# Patient Record
Sex: Female | Born: 1980 | Race: Black or African American | Hispanic: No | Smoking: Former smoker
Health system: Southern US, Community
[De-identification: ages and names within clinical notes are randomized; demographics above are authoritative.]

## PROBLEM LIST (undated history)

## (undated) ENCOUNTER — Emergency Department (HOSPITAL_COMMUNITY): Admission: EM | Payer: Managed Care, Other (non HMO)

## (undated) DIAGNOSIS — D649 Anemia, unspecified: Secondary | ICD-10-CM

## (undated) DIAGNOSIS — K259 Gastric ulcer, unspecified as acute or chronic, without hemorrhage or perforation: Secondary | ICD-10-CM

## (undated) DIAGNOSIS — R011 Cardiac murmur, unspecified: Secondary | ICD-10-CM

## (undated) DIAGNOSIS — K219 Gastro-esophageal reflux disease without esophagitis: Secondary | ICD-10-CM

## (undated) DIAGNOSIS — T7840XA Allergy, unspecified, initial encounter: Secondary | ICD-10-CM

## (undated) DIAGNOSIS — R519 Headache, unspecified: Secondary | ICD-10-CM

## (undated) DIAGNOSIS — Z8489 Family history of other specified conditions: Secondary | ICD-10-CM

## (undated) DIAGNOSIS — J45909 Unspecified asthma, uncomplicated: Secondary | ICD-10-CM

## (undated) HISTORY — DX: Allergy, unspecified, initial encounter: T78.40XA

## (undated) HISTORY — DX: Unspecified asthma, uncomplicated: J45.909

---

## 1999-05-30 ENCOUNTER — Emergency Department (HOSPITAL_COMMUNITY): Admission: EM | Admit: 1999-05-30 | Discharge: 1999-05-30 | Payer: Self-pay | Admitting: Emergency Medicine

## 1999-10-29 ENCOUNTER — Emergency Department (HOSPITAL_COMMUNITY): Admission: EM | Admit: 1999-10-29 | Discharge: 1999-10-29 | Payer: Self-pay | Admitting: Emergency Medicine

## 1999-11-25 ENCOUNTER — Encounter: Payer: Self-pay | Admitting: Emergency Medicine

## 1999-11-25 ENCOUNTER — Emergency Department (HOSPITAL_COMMUNITY): Admission: EM | Admit: 1999-11-25 | Discharge: 1999-11-25 | Payer: Self-pay | Admitting: Emergency Medicine

## 2000-02-08 ENCOUNTER — Emergency Department (HOSPITAL_COMMUNITY): Admission: EM | Admit: 2000-02-08 | Discharge: 2000-02-08 | Payer: Self-pay | Admitting: Emergency Medicine

## 2001-03-04 HISTORY — PX: ANKLE SURGERY: SHX546

## 2007-03-05 HISTORY — PX: ANKLE SURGERY: SHX546

## 2008-03-04 HISTORY — PX: HERNIA REPAIR: SHX51

## 2008-03-04 HISTORY — PX: TUBAL LIGATION: SHX77

## 2012-05-29 ENCOUNTER — Inpatient Hospital Stay (HOSPITAL_COMMUNITY)
Admission: AD | Admit: 2012-05-29 | Discharge: 2012-05-29 | Disposition: A | Discharge status: Home / Self Care | Payer: Medicaid Other | Source: Ambulatory Visit | Attending: Obstetrics & Gynecology | Admitting: Obstetrics & Gynecology

## 2012-05-29 DIAGNOSIS — N6459 Other signs and symptoms in breast: Secondary | ICD-10-CM | POA: Insufficient documentation

## 2012-05-29 DIAGNOSIS — R110 Nausea: Secondary | ICD-10-CM | POA: Insufficient documentation

## 2012-05-29 DIAGNOSIS — Z3202 Encounter for pregnancy test, result negative: Secondary | ICD-10-CM

## 2012-05-29 DIAGNOSIS — R5381 Other malaise: Secondary | ICD-10-CM | POA: Insufficient documentation

## 2012-05-29 LAB — POCT PREGNANCY, URINE: Preg Test, Ur: NEGATIVE

## 2012-05-29 LAB — HCG, QUANTITATIVE, PREGNANCY: hCG, Beta Chain, Quant, S: <1 m[IU]/mL (ref <5)

## 2012-05-29 NOTE — MAU Note (Addendum)
Regular cycles, last cycle was 31 days- so is late.  Having symptoms, tired, breast tenderness.  Had 'assure' procedure about 5 yrs ago.   2 home tests, one positive, one neg.  Wants to make sure is not preg with ectopic.  Had cramping felt like cycle was going to start and it didn't.

## 2012-05-29 NOTE — MAU Note (Signed)
Name and DOB verified, pt confirms spelling is correct on armband 

## 2012-05-29 NOTE — MAU Provider Note (Signed)
S: 32 y.o. presents to MAU 5 years post Essure procedure with menses 2 days late and pregnancy symptoms including breast tenderness, fatigue, nausea, and cravings.  Normally, she has regular menstrual cycles so being late is unusual for her.  Because of her Essure, she wants to make sure she does not have an ectopic pregnancy.  She denies abdominal pain, vaginal bleeding, vomiting, or fever/chills.    O: BP 112/61  Pulse 88  Temp(Src) 98.4 F (36.9 C) (Oral)  Resp 18  Ht 5' 4.5" (1.638 m)  Wt 75.751 kg (167 lb)  BMI 28.23 kg/m2  LMP 04/29/2012  Results for orders placed during the hospital encounter of 05/29/12 (from the past 24 hour(s))  POCT PREGNANCY, URINE     Status: None   Collection Time    05/29/12  4:13 PM      Result Value Range   Preg Test, Ur NEGATIVE  NEGATIVE  HCG, QUANTITATIVE, PREGNANCY     Status: None   Collection Time    05/29/12  4:39 PM      Result Value Range   hCG, Beta Chain, Quant, S <1  <5 mIU/mL    A: 1. Negative pregnancy test    P: D/C home Reviewed negative labs with pt Pt reassured that pregnancy not causing symptoms F/U with provider of her choice if no onset of menses in next week or two Return to MAU as needed  Sharen Counter Certified Nurse-Midwife

## 2014-07-07 ENCOUNTER — Ambulatory Visit (HOSPITAL_COMMUNITY)
Admission: RE | Admit: 2014-07-07 | Discharge: 2014-07-07 | Disposition: A | Discharge status: Home / Self Care | Payer: Self-pay | Source: Ambulatory Visit | Attending: Family Medicine | Admitting: Family Medicine

## 2014-07-07 ENCOUNTER — Other Ambulatory Visit (HOSPITAL_COMMUNITY): Payer: Self-pay | Admitting: Family Medicine

## 2014-07-07 DIAGNOSIS — E289 Ovarian dysfunction, unspecified: Secondary | ICD-10-CM | POA: Insufficient documentation

## 2014-07-07 DIAGNOSIS — Z9851 Tubal ligation status: Secondary | ICD-10-CM | POA: Insufficient documentation

## 2014-07-07 DIAGNOSIS — R1032 Left lower quadrant pain: Secondary | ICD-10-CM | POA: Insufficient documentation

## 2015-03-07 ENCOUNTER — Emergency Department (HOSPITAL_COMMUNITY)
Admission: EM | Admit: 2015-03-07 | Discharge: 2015-03-07 | Disposition: A | Discharge status: Home / Self Care | Payer: Self-pay | Attending: Emergency Medicine | Admitting: Emergency Medicine

## 2015-03-07 ENCOUNTER — Emergency Department (HOSPITAL_COMMUNITY): Payer: Self-pay

## 2015-03-07 ENCOUNTER — Emergency Department (HOSPITAL_COMMUNITY): Payer: Medicaid Other

## 2015-03-07 ENCOUNTER — Encounter (HOSPITAL_COMMUNITY): Payer: Self-pay | Admitting: Emergency Medicine

## 2015-03-07 DIAGNOSIS — M545 Low back pain: Secondary | ICD-10-CM | POA: Insufficient documentation

## 2015-03-07 DIAGNOSIS — R1011 Right upper quadrant pain: Secondary | ICD-10-CM | POA: Insufficient documentation

## 2015-03-07 DIAGNOSIS — M546 Pain in thoracic spine: Secondary | ICD-10-CM | POA: Insufficient documentation

## 2015-03-07 DIAGNOSIS — Z792 Long term (current) use of antibiotics: Secondary | ICD-10-CM | POA: Insufficient documentation

## 2015-03-07 DIAGNOSIS — Z87891 Personal history of nicotine dependence: Secondary | ICD-10-CM | POA: Insufficient documentation

## 2015-03-07 DIAGNOSIS — R110 Nausea: Secondary | ICD-10-CM | POA: Insufficient documentation

## 2015-03-07 DIAGNOSIS — Z8719 Personal history of other diseases of the digestive system: Secondary | ICD-10-CM | POA: Insufficient documentation

## 2015-03-07 DIAGNOSIS — R1013 Epigastric pain: Secondary | ICD-10-CM | POA: Insufficient documentation

## 2015-03-07 HISTORY — DX: Gastric ulcer, unspecified as acute or chronic, without hemorrhage or perforation: K25.9

## 2015-03-07 LAB — CBC WITH DIFFERENTIAL/PLATELET
BASOS ABS: 0 10*3/uL (ref 0.0–0.1)
BASOS PCT: 0 %
EOS ABS: 0.1 10*3/uL (ref 0.0–0.7)
Eosinophils Relative: 1 %
HEMATOCRIT: 36.4 % (ref 36.0–46.0)
HEMOGLOBIN: 11.7 g/dL — AB (ref 12.0–15.0)
Lymphocytes Relative: 10 %
Lymphs Abs: 0.9 10*3/uL (ref 0.7–4.0)
MCH: 25.8 pg — ABNORMAL LOW (ref 26.0–34.0)
MCHC: 32.1 g/dL (ref 30.0–36.0)
MCV: 80.4 fL (ref 78.0–100.0)
MONOS PCT: 5 %
Monocytes Absolute: 0.5 10*3/uL (ref 0.1–1.0)
NEUTROS ABS: 7.9 10*3/uL — AB (ref 1.7–7.7)
NEUTROS PCT: 84 %
Platelets: 250 10*3/uL (ref 150–400)
RBC: 4.53 MIL/uL (ref 3.87–5.11)
RDW: 13 % (ref 11.5–15.5)
WBC: 9.4 10*3/uL (ref 4.0–10.5)

## 2015-03-07 LAB — COMPREHENSIVE METABOLIC PANEL
ALBUMIN: 3.5 g/dL (ref 3.5–5.0)
ALK PHOS: 61 U/L (ref 38–126)
ALT: 15 U/L (ref 14–54)
ANION GAP: 10 (ref 5–15)
AST: 33 U/L (ref 15–41)
BILIRUBIN TOTAL: 0.5 mg/dL (ref 0.3–1.2)
BUN: 9 mg/dL (ref 6–20)
CO2: 24 mmol/L (ref 22–32)
Calcium: 8.8 mg/dL — ABNORMAL LOW (ref 8.9–10.3)
Chloride: 104 mmol/L (ref 101–111)
Creatinine, Ser: 0.89 mg/dL (ref 0.44–1.00)
GFR calc Af Amer: >60 mL/min (ref >60)
GFR calc non Af Amer: >60 mL/min (ref >60)
GLUCOSE: 113 mg/dL — AB (ref 65–99)
POTASSIUM: 4.1 mmol/L (ref 3.5–5.1)
SODIUM: 138 mmol/L (ref 135–145)
Total Protein: 6.4 g/dL — ABNORMAL LOW (ref 6.5–8.1)

## 2015-03-07 LAB — URINALYSIS, ROUTINE W REFLEX MICROSCOPIC
Bilirubin Urine: NEGATIVE
Glucose, UA: NEGATIVE mg/dL
Hgb urine dipstick: NEGATIVE
KETONES UR: NEGATIVE mg/dL
LEUKOCYTES UA: NEGATIVE
NITRITE: NEGATIVE
PROTEIN: NEGATIVE mg/dL
Specific Gravity, Urine: 1.007 (ref 1.005–1.030)
pH: 6.5 (ref 5.0–8.0)

## 2015-03-07 LAB — POC URINE PREG, ED: PREG TEST UR: NEGATIVE

## 2015-03-07 LAB — LIPASE, BLOOD: Lipase: 16 U/L (ref 11–51)

## 2015-03-07 MED ORDER — PANTOPRAZOLE SODIUM 20 MG PO TBEC: 20.0000 mg | DELAYED_RELEASE_TABLET | Freq: Every day | ORAL | Status: DC

## 2015-03-07 MED ORDER — ONDANSETRON 4 MG PO TBDP: 4.0000 mg | ORAL_TABLET | Freq: Once | ORAL | Status: DC | PRN

## 2015-03-07 MED ORDER — DOCUSATE SODIUM 100 MG PO CAPS: 100.0000 mg | ORAL_CAPSULE | Freq: Two times a day (BID) | ORAL | Status: DC

## 2015-03-07 MED ORDER — ONDANSETRON HCL 4 MG/2ML IJ SOLN
4.0000 mg | Freq: Once | INTRAMUSCULAR | Status: AC
  Administered 2015-03-07: 4 mg via INTRAVENOUS
  Filled 2015-03-07: qty 2

## 2015-03-07 MED ORDER — SODIUM CHLORIDE 0.9 % IV BOLUS (SEPSIS)
1000.0000 mL | Freq: Once | INTRAVENOUS | Status: AC
  Administered 2015-03-07: 1000 mL via INTRAVENOUS

## 2015-03-07 MED ORDER — PROMETHAZINE HCL 25 MG/ML IJ SOLN
25.0000 mg | Freq: Once | INTRAMUSCULAR | Status: AC
  Administered 2015-03-07: 25 mg via INTRAVENOUS
  Filled 2015-03-07: qty 1

## 2015-03-07 MED ORDER — MORPHINE SULFATE (PF) 4 MG/ML IV SOLN
4.0000 mg | Freq: Once | INTRAVENOUS | Status: AC
  Administered 2015-03-07: 4 mg via INTRAVENOUS
  Filled 2015-03-07: qty 1

## 2015-03-07 MED ORDER — MORPHINE SULFATE (PF) 4 MG/ML IV SOLN
4.0000 mg | Freq: Once | INTRAVENOUS | Status: AC
Start: 2015-03-07 — End: 2015-03-07
  Administered 2015-03-07: 4 mg via INTRAVENOUS
  Filled 2015-03-07: qty 1

## 2015-03-07 MED ORDER — GI COCKTAIL ~~LOC~~
30.0000 mL | Freq: Once | ORAL | Status: AC
  Administered 2015-03-07: 30 mL via ORAL
  Filled 2015-03-07: qty 30

## 2015-03-07 MED ORDER — HYDROCODONE-ACETAMINOPHEN 5-325 MG PO TABS: 2.0000 | ORAL_TABLET | ORAL | Status: DC | PRN

## 2015-03-07 MED ORDER — HYDROMORPHONE HCL 1 MG/ML IJ SOLN
1.0000 mg | Freq: Once | INTRAMUSCULAR | Status: AC
  Administered 2015-03-07: 1 mg via INTRAVENOUS
  Filled 2015-03-07: qty 1

## 2015-03-07 MED ORDER — ONDANSETRON 4 MG PO TBDP: ORAL_TABLET | ORAL | Status: DC

## 2015-03-07 MED ORDER — DICYCLOMINE HCL 20 MG PO TABS: 20.0000 mg | ORAL_TABLET | Freq: Two times a day (BID) | ORAL | Status: DC | PRN

## 2015-03-07 NOTE — Discharge Instructions (Signed)

## 2015-03-07 NOTE — ED Notes (Signed)
Pt presents with [facility] from home ambulatory from ambulance c/o RUQ abd pain that is described as stabbing and radiates to midback; pt also reports nausea without vomiting or diarrhea; pt denies fever, rebound tenderness; pt states she has a hx of stomach ulcer and gallbladder sludge

## 2015-03-07 NOTE — ED Notes (Signed)
Patient transported to Ultrasound 

## 2015-03-07 NOTE — ED Notes (Signed)
Pt requested to go to restroom.  This RN stayed with patient while ambulating to bathroom.  Pt's gait is steady and even.

## 2015-03-07 NOTE — ED Provider Notes (Signed)
CSN: [id number]     Arrival date & time 03/07/15  0036 History  By signing my name below, I, Kylie Rivas, attest that this documentation has been prepared under the direction and in the presence of Loren Raceravid Adelbert Gaspard, MD . Electronically Signed: Freida Busmaniana Rivas, Scribe. 03/07/2015. 3:27 AM.     Chief Complaint  Patient presents with  . Abdominal Pain  . Nausea     The history is provided by the patient. No language interpreter was used.   HPI Comments:  Kylie Rivas is a 35 y.o. female who presents to the Emergency Department complaining of epigastric abdominal pain that radiates to her RUQ and to back onset ~ 2200 last night. She notes her pain has been constant since onset but has improved since onset. Pt reports associated nausea. She reports h/o stomach ulcer; states at that time she had abdominal imaging that showed gallbladder sludge.She denies fever, dysuria, hematuria, diarrhea, vomiting, constipation, and swelling in her BLE.  Her last normal BM was yesterday afternoon. No alleviating factors noted. She reports h/o of abdominal surgery as well-hernia repair.   Past Medical History  Diagnosis Date  . Stomach ulcer    Past Surgical History  Procedure Laterality Date  . Hernia repair  2010  . Ankle surgery Right 2013  . Ankle surgery Left 2009  . Tubal ligation  2010   History reviewed. No pertinent family history. Social History  Substance Use Topics  . Smoking status: Former Smoker    Quit date: 02/28/2015  . Smokeless tobacco: None  . Alcohol Use: Yes     Comment: socially   OB History    No data available     Review of Systems  Constitutional: Negative for fever and chills.  Respiratory: Negative for shortness of breath.   Cardiovascular: Negative for chest pain and leg swelling.  Gastrointestinal: Positive for nausea and abdominal pain. Negative for vomiting, diarrhea, constipation and blood in stool.  Genitourinary: Positive for flank pain. Negative for  dysuria, hematuria and pelvic pain.  Musculoskeletal: Positive for myalgias and back pain. Negative for neck pain and neck stiffness.  Skin: Negative for rash and wound.  Neurological: Negative for dizziness, weakness, light-headedness, numbness and headaches.  All other systems reviewed and are negative.    Allergies  Aspirin and Iodinated diagnostic agents  Home Medications   Prior to Admission medications   Medication Sig Start Date End Date Taking? Authorizing Provider  doxycycline (VIBRAMYCIN) 100 MG capsule Take 100 mg by mouth 2 (two) times daily. 02/16/15  Yes Historical Provider, MD  dicyclomine (BENTYL) 20 MG tablet Take 1 tablet (20 mg total) by mouth 2 (two) times daily as needed for spasms. 03/07/15   Loren Raceravid Angelle Isais, MD  docusate sodium (COLACE) 100 MG capsule Take 1 capsule (100 mg total) by mouth every 12 (twelve) hours. 03/07/15   Loren Raceravid Jaye Saal, MD  HYDROcodone-acetaminophen (NORCO) 5-325 MG tablet Take 2 tablets by mouth every 4 (four) hours as needed. 03/07/15   Loren Raceravid Lundyn Coste, MD  ondansetron (ZOFRAN ODT) 4 MG disintegrating tablet 4mg  ODT q4 hours prn nausea/vomit 03/07/15   Loren Raceravid Joeanne Robicheaux, MD  pantoprazole (PROTONIX) 20 MG tablet Take 1 tablet (20 mg total) by mouth daily. 03/07/15   Loren Raceravid Ardie Dragoo, MD   BP 104/68 mmHg  Pulse 69  Temp(Src) 98.3 F (36.8 C) (Oral)  Resp 16  Ht 5\' 4"  (1.626 m)  Wt 153 lb (69.4 kg)  BMI 26.25 kg/m2  SpO2 99%  LMP 02/24/2015 Physical Exam  Constitutional: She  is oriented to person, place, and time. She appears well-developed and well-nourished. No distress.  HENT:  Head: Normocephalic and atraumatic.  Mouth/Throat: Oropharynx is clear and moist.  Eyes: EOM are normal. Pupils are equal, round, and reactive to light.  Neck: Normal range of motion. Neck supple.  Cardiovascular: Normal rate and regular rhythm.   Pulmonary/Chest: Effort normal and breath sounds normal. No respiratory distress. She has no wheezes. She has no rales.   Abdominal: Soft. Bowel sounds are normal.  Patient has epigastric and right upper quadrant tenderness with palpation. There is no rebound or guarding.  Musculoskeletal: Normal range of motion. She exhibits tenderness. She exhibits no edema.  Patient with tenderness along the right paraspinal musculature of the lower thoracic and upper lumbar region. No definite CVA tenderness. No lower extremity swelling or pain.  Neurological: She is alert and oriented to person, place, and time.  Moves all extremities without deficit. Sensation is fully intact.  Skin: Skin is warm and dry. No rash noted. No erythema.  Psychiatric: She has a normal mood and affect. Her behavior is normal.  Nursing note and vitals reviewed.   ED Course  Procedures   DIAGNOSTIC STUDIES:  Oxygen Saturation is 100% on RA, normal by my interpretation.    COORDINATION OF CARE:  2:03 AM Discussed treatment plan with pt at bedside and pt agreed to plan.  Labs Review Labs Reviewed  URINALYSIS, ROUTINE W REFLEX MICROSCOPIC (NOT AT [facility]) - Abnormal; Notable for the following:    Color, Urine STRAW (*)    All other components within normal limits  CBC WITH DIFFERENTIAL/PLATELET - Abnormal; Notable for the following:    Hemoglobin 11.7 (*)    MCH 25.8 (*)    Neutro Abs 7.9 (*)    All other components within normal limits  COMPREHENSIVE METABOLIC PANEL - Abnormal; Notable for the following:    Glucose, Bld 113 (*)    Calcium 8.8 (*)    Total Protein 6.4 (*)    All other components within normal limits  LIPASE, BLOOD  POC URINE PREG, ED    Imaging Review Ct Abdomen Pelvis Wo Contrast  03/07/2015  CLINICAL DATA:  Acute onset of right upper quadrant abdominal pain, radiating to the mid back. Nausea. Initial encounter. EXAM: CT ABDOMEN AND PELVIS WITHOUT CONTRAST TECHNIQUE: Multidetector CT imaging of the abdomen and pelvis was performed following the standard protocol without IV contrast. COMPARISON:  Right upper quadrant  ultrasound performed earlier today at 2:23 a.m., and pelvic ultrasound performed 07/07/2014 FINDINGS: Minimal bibasilar atelectasis is noted. The liver and spleen are unremarkable in appearance. The gallbladder is within normal limits. The pancreas and adrenal glands are unremarkable. The kidneys are unremarkable in appearance. There is no evidence of hydronephrosis. No renal or ureteral stones are seen. No perinephric stranding is appreciated. No free fluid is identified. The small bowel is unremarkable in appearance. The stomach is within normal limits. No acute vascular abnormalities are seen. The appendix is normal in caliber, without evidence of appendicitis. The colon is unremarkable in appearance. The bladder is moderately distended and grossly unremarkable. The uterus is unremarkable in appearance. Postoperative change is noted along the fallopian tubes bilaterally. No inguinal lymphadenopathy is seen. No acute osseous abnormalities are identified. IMPRESSION: No acute abnormality seen within the abdomen or pelvis. Electronically Signed   By: Roanna Raider M.D.   On: 03/07/2015 05:43   US Abdomen Limited  03/07/2015  CLINICAL DATA:  Acute onset of right upper quadrant abdominal pain.  Initial encounter. EXAM: US ABDOMEN LIMITED - RIGHT UPPER QUADRANT COMPARISON:  None. FINDINGS: Gallbladder: No gallstones or wall thickening visualized. Partially contracted. No sonographic Murphy sign noted by sonographer. Common bile duct: Diameter: 0.3 cm, within normal limits in caliber. Liver: A mildly heterogeneous hyperechoic focus is noted within the right hepatic lobe, measuring 1.7 x 1.5 x 1.7 cm. This is nonspecific. Within normal limits in parenchymal echogenicity. IMPRESSION: 1. No acute abnormality seen within the right upper quadrant. 2. Mildly heterogeneous hyperechoic focus within the right hepatic lobe, measuring 1.7 cm. Though this may reflect an hemangioma, this is nonspecific. Further evaluation (dynamic  liver protocol MRI or CT) could be considered as deemed clinically appropriate. Electronically Signed   By: Roanna Raider M.D.   On: 03/07/2015 03:38   I have personally reviewed and evaluated these images and lab results as part of my medical decision-making.   EKG Interpretation None      MDM   Final diagnoses:  Epigastric pain    I personally performed the services described in this documentation, which was scribed in my presence. The recorded information has been reviewed and is accurate.   Patient with persistent pain despite medication. Ultrasound without evidence of really very cause. Laboratory workup is normal. CT abdomen was requested by the patient. I think unlikely, possibly renal stone could be causing the patient's symptoms.  CT scan without acute abnormality. Patient does have moderate amount stool in the right colon. She is feeling much better after IV medication. We'll discharge home to follow-up with gastroenterology. Return precautions have been given.   Loren Racer, MD 03/07/15 [phone]

## 2016-10-07 ENCOUNTER — Encounter: Payer: Self-pay | Admitting: Internal Medicine

## 2016-10-07 ENCOUNTER — Ambulatory Visit (INDEPENDENT_AMBULATORY_CARE_PROVIDER_SITE_OTHER): Payer: Self-pay | Admitting: Internal Medicine

## 2016-10-07 VITALS — BP 108/68 | HR 74 | Resp 14 | Ht 64.5 in | Wt 171.0 lb

## 2016-10-07 DIAGNOSIS — J45909 Unspecified asthma, uncomplicated: Secondary | ICD-10-CM | POA: Insufficient documentation

## 2016-10-07 DIAGNOSIS — T7840XA Allergy, unspecified, initial encounter: Secondary | ICD-10-CM | POA: Insufficient documentation

## 2016-10-07 DIAGNOSIS — R300 Dysuria: Secondary | ICD-10-CM

## 2016-10-07 DIAGNOSIS — Z124 Encounter for screening for malignant neoplasm of cervix: Secondary | ICD-10-CM

## 2016-10-07 DIAGNOSIS — E010 Iodine-deficiency related diffuse (endemic) goiter: Secondary | ICD-10-CM

## 2016-10-07 DIAGNOSIS — N898 Other specified noninflammatory disorders of vagina: Secondary | ICD-10-CM

## 2016-10-07 LAB — POCT URINALYSIS DIPSTICK
BILIRUBIN UA: NEGATIVE
GLUCOSE UA: NEGATIVE
KETONES UA: NEGATIVE
Nitrite, UA: NEGATIVE
PROTEIN UA: 15
SPEC GRAV UA: 1.02 (ref 1.010–1.025)
Urobilinogen, UA: 0.2 E.U./dL
pH, UA: 5 (ref 5.0–8.0)

## 2016-10-07 LAB — POCT WET PREP WITH KOH
CLUE CELLS WET PREP PER HPF POC: NEGATIVE
KOH Prep POC: NEGATIVE
RBC Wet Prep HPF POC: NEGATIVE
TRICHOMONAS UA: NEGATIVE
YEAST WET PREP PER HPF POC: NEGATIVE

## 2016-10-07 MED ORDER — EPINEPHRINE 0.3 MG/0.3ML IJ SOAJ: 0.3000 mg | Freq: Once | INTRAMUSCULAR | 1 refills | Status: AC

## 2016-10-07 MED ORDER — NITROFURANTOIN MONOHYD MACRO 100 MG PO CAPS: 100.0000 mg | ORAL_CAPSULE | Freq: Two times a day (BID) | ORAL | 0 refills | Status: DC

## 2016-10-07 MED ORDER — PHENAZOPYRIDINE HCL 200 MG PO TABS: 200.0000 mg | ORAL_TABLET | Freq: Three times a day (TID) | ORAL | 0 refills | Status: DC | PRN

## 2016-10-07 NOTE — Progress Notes (Signed)
Subjective:    Patient ID: Kylie Rivas, female    DOB: [birth year], 36 y.o.   MRN: [id number]  HPI   New to establish  Treated for UTI with Cipro 500 mg twice daily for 7 days starting about 09/21/2016.   Had similar symptoms to what she has had for past 3 days.   Dysuria with initiation of urination, mild hematuria.  At end of urination, deep itching sensation. No fever or flank pain, though previously before treatment, had bilateral low back pain and left sided pelvic pain.  Again, those are not symptoms now. No vaginal discharge, no odor.   Is sexually active with one female partner and he is not having any symptoms to her knowledge.  They do not use condoms.  Would like to include pap today as performing GU exam.  Last pap 2013.  Always normal in past.  Mother and sister with abnormal paps in past.  No outpatient prescriptions have been marked as taking for the 10/07/16 encounter (Office Visit) with Mack Hook, MD.    Allergies  Allergen Reactions  . Aspirin Anaphylaxis  . Iodinated Diagnostic Agents Anaphylaxis    Past Medical History:  Diagnosis Date  . Allergy    Bee stings, shellfish  . Asthma    winter time.  Last episode 2017  . Stomach ulcer     Past Surgical History:  Procedure Laterality Date  . ANKLE SURGERY Right 2003   ligament repair  . ANKLE SURGERY Left 2009   LIgament repair  . HERNIA REPAIR  7096   umbilical  . TUBAL LIGATION  2010    Family History  Problem Relation Age of Onset  . Diabetes Mother   . Hypertension Mother   . Seizures Mother        due to strokes in past  . Stroke Mother   . Kidney disease Mother        recurrent kidney stones--unclear what type  . Heart disease Mother        Ischemic Heart Disease  . Pulmonary embolism Mother        unknown clotting disorder.  Patient states she was tested for same and did not have.  . Diabetes Father   . Heart disease Father        MI  . Stroke Father   . Asthma  Sister   . Hypertension Sister   . Cancer Brother        Primary lung, but met to nasopharyngeal area.    Social History   Social History  . Marital status: Married    Spouse name: N/A  . Number of children: N/A  . Years of education: N/A   Occupational History  . Not on file.   Social History Main Topics  . Smoking status: Former Smoker    Quit date: 02/28/2015  . Smokeless tobacco: Not on file  . Alcohol use Yes     Comment: socially  . Drug use: No  . Sexual activity: Not on file   Other Topics Concern  . Not on file   Social History Narrative  . No narrative on file       Review of Systems     Objective:   Physical Exam  NAD HEENT:  PERRL, EOMI, TMs pearly gray, throat without injection Neck: Supple, mild thyroid enlargement. Chest:  CTA CV:  RRR with normal S1 and S2, No S3, S4 or murmur. Radial and DP pulses normal and equal Abd:  S,  Mild suprapubic tenderness, no flank tenderness, No HSM or mass, + BS GU:  Normal external genitalia.  Scant vaginal discharge in proximal vaginal canal.  Cervix without lesion.  No CMT.  No uterine or adnexal mass or tenderness.  UA with trace Leuk/protein and blood. Wet prep unremarkable.      Assessment & Plan:  1.  Dysuria:  Send urine for culture.  Pyridium and Macrobid.    2.  Vaginal Discharge:  No findings on wet prep.  Sending GC/chlamydia  3.  Thyromegaly:  TSH.  4.  HM:  Pap today.

## 2016-10-08 LAB — GC/CHLAMYDIA PROBE AMP
CHLAMYDIA, DNA PROBE: NEGATIVE
NEISSERIA GONORRHOEAE BY PCR: NEGATIVE

## 2016-10-08 LAB — TSH: TSH: 2.1 u[IU]/mL (ref 0.450–4.500)

## 2016-10-08 LAB — CYTOLOGY - PAP

## 2016-10-09 LAB — URINE CULTURE

## 2016-11-20 ENCOUNTER — Ambulatory Visit (INDEPENDENT_AMBULATORY_CARE_PROVIDER_SITE_OTHER): Payer: Medicaid Other | Admitting: Internal Medicine

## 2016-11-20 VITALS — BP 120/80 | HR 80 | Temp 98.7°F

## 2016-11-20 DIAGNOSIS — J0140 Acute pansinusitis, unspecified: Secondary | ICD-10-CM

## 2016-11-20 DIAGNOSIS — R519 Headache, unspecified: Secondary | ICD-10-CM

## 2016-11-20 DIAGNOSIS — R51 Headache: Secondary | ICD-10-CM | POA: Diagnosis not present

## 2016-11-20 MED ORDER — AMOXICILLIN-POT CLAVULANATE 875-125 MG PO TABS: 1.0000 | ORAL_TABLET | Freq: Two times a day (BID) | ORAL | 0 refills | Status: DC

## 2016-11-20 MED ORDER — FLUCONAZOLE 150 MG PO TABS: ORAL_TABLET | ORAL | 0 refills | Status: DC

## 2016-12-09 ENCOUNTER — Encounter: Payer: Self-pay | Admitting: Internal Medicine

## 2016-12-25 ENCOUNTER — Telehealth: Payer: Self-pay | Admitting: Internal Medicine

## 2016-12-25 NOTE — Telephone Encounter (Signed)
Patient would like Dicyclomine 20mg  1 twice a day as needed for spasms.  Patient would also like zantac.  [facility] on [address].

## 2016-12-26 MED ORDER — DICYCLOMINE HCL 20 MG PO TABS: ORAL_TABLET | ORAL | 0 refills | Status: DC

## 2016-12-26 MED ORDER — FAMOTIDINE 20 MG PO TABS: ORAL_TABLET | ORAL | 2 refills | Status: DC

## 2016-12-26 NOTE — Telephone Encounter (Signed)
Reported in person she is having epigastric pain--burning mainly with some sense of cramping.  Using NSAIDS regularly.  No melena or hematochezia.  Mild tenderness over midepigastric area only--not R or L UQ  Discussed using Tylenol for headache pain and stopping NSAIDS.  Will send in Rx for Famotidine and short term Rx for Dicyclomine.

## 2016-12-29 ENCOUNTER — Encounter: Payer: Self-pay | Admitting: Internal Medicine

## 2016-12-29 NOTE — Progress Notes (Signed)
   Subjective:    Patient ID: Kylie Rivas, female    DOB: Aug 28, 1980, 36 y.o.   MRN: [id number]  HPI   Saw Minka in her home as was having a severe headache and unable to drive.   Pain from back of head to frontal areas bilaterally.  No definite fever.  Very nauseated with headache.   Lot of pain in bifrontal and maxillary areas as well. + congestion with cough for a number of days. Had cut back on caffeinated sodas signficantly in recent days. Ibuprofen not touching the headache pain and difficulty keeping them down. No diarrhea  No outpatient prescriptions have been marked as taking for the 11/20/16 encounter (Office Visit) with Julieanne MansonMulberry, Treyveon Mochizuki, MD.   Allergies  Allergen Reactions  . Aspirin Anaphylaxis  . Iodinated Diagnostic Agents Anaphylaxis      Review of Systems     Objective:   Physical Exam Sitting in bed in mild to moderate distress with headache HEENT:  PERRL, EOMI, TMs pearly gray. Tender over bilateral frontal and maxillary sinuses.  Throat without injection Neck:  Supple, No adenopathy Chest:  CTA CV:  RRR without murmur or rub, radial pulses normal and equal Abd:  S, NT, No HSM or masses, + BS Skin:  No rash         Assessment & Plan:  Possible acute sinusitis:  Treat with Augmentin 875 mg/125 mg twice daily for 10 days.  Fluconazole filled should she develop a yeast infection.    Headache likely also related to sudden discontinuation of her significant soda/caffeine intake.  Discussed taking in some caffeine today and to gradually wean herself from caffeinated soda

## 2017-06-27 ENCOUNTER — Other Ambulatory Visit (INDEPENDENT_AMBULATORY_CARE_PROVIDER_SITE_OTHER): Payer: BLUE CROSS/BLUE SHIELD

## 2017-06-27 DIAGNOSIS — R635 Abnormal weight gain: Secondary | ICD-10-CM | POA: Diagnosis not present

## 2017-06-27 DIAGNOSIS — L659 Nonscarring hair loss, unspecified: Secondary | ICD-10-CM | POA: Diagnosis not present

## 2017-06-27 DIAGNOSIS — E7800 Pure hypercholesterolemia, unspecified: Secondary | ICD-10-CM

## 2017-06-27 DIAGNOSIS — R5383 Other fatigue: Secondary | ICD-10-CM

## 2017-06-27 DIAGNOSIS — L299 Pruritus, unspecified: Secondary | ICD-10-CM

## 2017-06-27 NOTE — Patient Instructions (Signed)
Nizoral shampoo

## 2017-06-27 NOTE — Progress Notes (Signed)
   Subjective:    Patient ID: Beryl Meagerherice Underwood-Hazley, female    DOB: June 30, 1980, 37 y.o.   MRN: [id number]  HPI   Hairstylist yesterday noted hair loss on top of Jaylenn's head--seems to be progressing over last few months.  Also has noted loss from lateral eyebrows.  Has had flaking and itching of entire scalp, though worse on top. The latter over last 2 months. Has not changed hair care products, though she notes this really seemed to start after she began getting her hair cut short with current hairstylist. Patient complains of fatigue, weight gain.  No definite skin dryness or flaking elsewhere. Has had both constipation and diarrhea.  Basically, has hard stool, followed by loose watery stools. Has tried to change her diet as was drinking a lot of soda and snack foods, but has backed off that substantially and has continued to gain weight. Stopped smoking 01/11/2017. Has also been having hot flashes and night sweats for the last year.  Periods are now every 3 weeks--also for maybe the last year. Was noted in previous exams to have a generous thyroid or even a nodule, the latter in 2012.  She has never had an ultrasound.  TSH 10/07/2016 was normal. Significant stress in life.  No outpatient medications have been marked as taking for the 06/27/17 [facility] (Lab[facility].    Allergies  Allergen Reactions  . Aspirin Anaphylaxis  . Iodinated Diagnostic Agents Anaphylaxis          Review of Systems     Objective:   Physical Exam NAD HEENT:  Top of scalp with flaking with underlying mild erythema and shininess of scalp.  Significant generalized hair loss. Unable to see significant hair loss at eyebrows as penciled in. Neck:  Supple, No adenopathy, mild thyromegaly, but no definite nodule. CV:  RRR Skin :  No dryness or rash Neuro:  Ankle reflexes 2+/4       Assessment & Plan:  1.  Fatigue, weight gain, hair loss:  Recheck TSH, also CBC, CMP.  Encouraged  counseling for stress as well  2.  Hair loss with inflammatory changes to scalp:  [facility]  If significant delay in referral, will reconsider local dermatologist. To check with hairstylist about her hair care products used when Eddie is there. Nizoral shampoo at least 3 times weekly.  3.  Weight gain:  To continue to work on diet/soda intake and increase physical activity as well as work on stress.  4.  History of hypercholesterolemia:  Lipids, especially with weight gain.

## 2017-06-28 LAB — CBC WITH DIFFERENTIAL/PLATELET
BASOS ABS: 0 10*3/uL (ref 0.0–0.2)
Basos: 1 %
EOS (ABSOLUTE): 0.3 10*3/uL (ref 0.0–0.4)
Eos: 4 %
Hematocrit: 37.5 % (ref 34.0–46.6)
Hemoglobin: 11.8 g/dL (ref 11.1–15.9)
IMMATURE GRANULOCYTES: 0 %
Immature Grans (Abs): 0 10*3/uL (ref 0.0–0.1)
Lymphocytes Absolute: 2.4 10*3/uL (ref 0.7–3.1)
Lymphs: 36 %
MCH: 25.9 pg — ABNORMAL LOW (ref 26.6–33.0)
MCHC: 31.5 g/dL (ref 31.5–35.7)
MCV: 82 fL (ref 79–97)
MONOS ABS: 0.5 10*3/uL (ref 0.1–0.9)
Monocytes: 8 %
NEUTROS PCT: 51 %
Neutrophils Absolute: 3.4 10*3/uL (ref 1.4–7.0)
PLATELETS: 315 10*3/uL (ref 150–379)
RBC: 4.56 x10E6/uL (ref 3.77–5.28)
RDW: 13.8 % (ref 12.3–15.4)
WBC: 6.7 10*3/uL (ref 3.4–10.8)

## 2017-06-28 LAB — COMPREHENSIVE METABOLIC PANEL
ALK PHOS: 57 IU/L (ref 39–117)
ALT: 6 IU/L (ref 0–32)
AST: 9 IU/L (ref 0–40)
Albumin/Globulin Ratio: 1.6 (ref 1.2–2.2)
Albumin: 4.1 g/dL (ref 3.5–5.5)
BUN/Creatinine Ratio: 17 (ref 9–23)
BUN: 13 mg/dL (ref 6–20)
Bilirubin Total: <0.2 mg/dL (ref 0.0–1.2)
CALCIUM: 8.9 mg/dL (ref 8.7–10.2)
CO2: 24 mmol/L (ref 20–29)
CREATININE: 0.76 mg/dL (ref 0.57–1.00)
Chloride: 105 mmol/L (ref 96–106)
GFR calc Af Amer: 116 mL/min/{1.73_m2} (ref >59)
GFR, EST NON AFRICAN AMERICAN: 101 mL/min/{1.73_m2} (ref >59)
Globulin, Total: 2.5 g/dL (ref 1.5–4.5)
Glucose: 90 mg/dL (ref 65–99)
POTASSIUM: 4.4 mmol/L (ref 3.5–5.2)
Sodium: 141 mmol/L (ref 134–144)
Total Protein: 6.6 g/dL (ref 6.0–8.5)

## 2017-06-28 LAB — LIPID PANEL W/O CHOL/HDL RATIO
Cholesterol, Total: 183 mg/dL (ref 100–199)
HDL: 55 mg/dL (ref >39)
LDL CALC: 101 mg/dL — AB (ref 0–99)
Triglycerides: 134 mg/dL (ref 0–149)
VLDL Cholesterol Cal: 27 mg/dL (ref 5–40)

## 2017-06-28 LAB — TSH: TSH: 1.84 u[IU]/mL (ref 0.450–4.500)

## 2017-07-08 ENCOUNTER — Other Ambulatory Visit: Payer: Self-pay | Admitting: Internal Medicine

## 2017-07-08 ENCOUNTER — Encounter: Payer: Self-pay | Admitting: Internal Medicine

## 2017-07-08 MED ORDER — ALPRAZOLAM 1 MG PO TABS: ORAL_TABLET | ORAL | 0 refills | Status: DC

## 2017-07-08 NOTE — Progress Notes (Signed)
Has decisions to make to keep family safe.

## 2017-08-14 ENCOUNTER — Telehealth: Payer: Self-pay | Admitting: Internal Medicine

## 2017-08-14 MED ORDER — AZITHROMYCIN 500 MG PO TABS: ORAL_TABLET | ORAL | 0 refills | Status: DC

## 2017-08-14 NOTE — Telephone Encounter (Signed)
Cough for 1 week that is worsening with posterior pharyngeal drainage, productive of thick dark yellow mucous.  Chest wall starting to hurt with cough.  Nose running as well and congested. No definite dyspnea.  Unable to sleep flat due to drainage. Has been trying Tylenol severe sinus OTC with ibuprofen, but stopped all as her stomach starting hurting. No fever.  HEENT:  Tender over maxillary sinuses, TMs pearly gray, throat without injection or exudate. Neck:  Supple no adenopathy, generous thyroid without nodule. Chest:  CTA CV:  RRR without murmur or rub.  A/P:  Acute sinusitis:  Dayquil/Nyquil combination. Azithromycin 500 mg once daily for 5 days.

## 2017-09-09 ENCOUNTER — Encounter: Payer: Self-pay | Admitting: Internal Medicine

## 2017-09-09 ENCOUNTER — Ambulatory Visit (INDEPENDENT_AMBULATORY_CARE_PROVIDER_SITE_OTHER): Payer: BLUE CROSS/BLUE SHIELD | Admitting: Internal Medicine

## 2017-09-09 VITALS — BP 100/62 | HR 78 | Resp 12 | Ht 64.5 in | Wt 201.0 lb

## 2017-09-09 DIAGNOSIS — J0101 Acute recurrent maxillary sinusitis: Secondary | ICD-10-CM

## 2017-09-09 MED ORDER — FLUCONAZOLE 150 MG PO TABS: ORAL_TABLET | ORAL | 0 refills | Status: DC

## 2017-09-09 MED ORDER — AMOXICILLIN-POT CLAVULANATE 875-125 MG PO TABS: ORAL_TABLET | ORAL | 0 refills | Status: DC

## 2017-09-09 MED ORDER — PREDNISONE 20 MG PO TABS: ORAL_TABLET | ORAL | 0 refills | Status: DC

## 2017-09-09 NOTE — Progress Notes (Signed)
   Subjective:    Patient ID: Kylie Rivas, female    DOB: November 17, 1980, 37 y.o.   MRN: [id number]  HPI   Has been dealing with cough productive of green and yellow mucous with sinus pressure and posterior pharyngeal drainage.   Was treated with Azithromycin 500 mg once daily for 5 days starting just after 08/14/2017. Her mucous cleared, cough improved.  She continued with posterior pharyngeal drainage. At the time she moved to another home.  The home previously with a smoker.  She sometimes feels she smells an odor.  She does not note a musty odor.  Has not noted any mold or mildew.  No insect infestation.  Windows are closed.  Air conditioning works well.   Started feeling poorly again 5 days ago.  Started sneezing and tickle in throat.  Cough worsened again 2 days ago.  Was vomiting with amount of posterior pharyngeal drainage.  Mucous is now yellow and brown.   Feels most of her cough is just from drainage.  Has to prop up at night.   No fever, but sweating a lot. Taking Fexofenadine for 3 days.   Taking Mucinex as well--helps for a while to decrease mucous production. Has not returned to smoking.  Current Meds  Medication Sig  . famotidine (PEPCID) 20 MG tablet 2 tabs by mouth at bedtime daily  . Fexofenadine HCl (MUCINEX ALLERGY PO) Take by mouth.    Allergies  Allergen Reactions  . Aspirin Anaphylaxis  . Iodinated Diagnostic Agents Anaphylaxis   Review of Systems     Objective:   Physical Exam  NAD HEENT:  PERRL, EOMI, TMs pearly gray.  Tender over maxillary sinuses.  Left nasal mucosa more than left with swelling and redness with yellow discharge.  Throat without injection or exudate. Neck:  Supple, No adenopathy Chest:  CTA CV:  RRR without murmur or rub.  Radial pulses normal and equal Abd:  S, NT, No HSM or mass, + BS    Assessment & Plan:  Sinusitis:  Augmentin 875/125 mg twice daily for 10 days.  Prednisone 20 mg daily for 5 days.   Nasal  saline. Mucinex Fluconazole Rx sent if develops vaginal yeast.

## 2017-09-09 NOTE — Patient Instructions (Addendum)
Drink lots of water. Get a neti pot and use daily for saline flush of sinuses Look at your intake vent in home and make sure it and filter are clean.  Mucinex or Dayquil during day and Nyquil at night.   Elevate HOB for sleep

## 2018-01-09 ENCOUNTER — Encounter: Payer: Self-pay | Admitting: Internal Medicine

## 2018-01-09 MED ORDER — DICYCLOMINE HCL 20 MG PO TABS: ORAL_TABLET | ORAL | 1 refills | Status: DC

## 2018-01-09 NOTE — Progress Notes (Signed)
Under lots of stress. Brother with recurrence of metastatic lung cancer (sinuses/brain) Has been having alternating constipation/diarrhea with lots of mucous with stools. No melena or hematochezia Discussed IBS and starting 1 cap of Metamucil daily and to gradually work up to 4 caps daily Will also try antispasmodic, dicyclomine

## 2018-01-16 ENCOUNTER — Ambulatory Visit (INDEPENDENT_AMBULATORY_CARE_PROVIDER_SITE_OTHER): Payer: BLUE CROSS/BLUE SHIELD | Admitting: Internal Medicine

## 2018-01-16 ENCOUNTER — Encounter: Payer: Self-pay | Admitting: Internal Medicine

## 2018-01-16 VITALS — BP 110/64 | HR 70 | Resp 12 | Ht 64.5 in | Wt 198.0 lb

## 2018-01-16 DIAGNOSIS — G4700 Insomnia, unspecified: Secondary | ICD-10-CM

## 2018-01-16 DIAGNOSIS — K582 Mixed irritable bowel syndrome: Secondary | ICD-10-CM

## 2018-01-16 DIAGNOSIS — F439 Reaction to severe stress, unspecified: Secondary | ICD-10-CM

## 2018-01-16 DIAGNOSIS — S01512A Laceration without foreign body of oral cavity, initial encounter: Secondary | ICD-10-CM | POA: Diagnosis not present

## 2018-01-16 MED ORDER — MUPIROCIN 2 % EX OINT: 1.0000 "application " | TOPICAL_OINTMENT | Freq: Two times a day (BID) | CUTANEOUS | 0 refills | Status: DC
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
## 2018-01-16 NOTE — Patient Instructions (Addendum)
Goal #1:  Get a calendar with space for writing goals  Goal #2:  Improve Sleep  Regular bedtime and awake time--even on weekends or Wednesday  Turn off TV about 1 hour before bedtime--consider warm bath/read to calm  If unable to fall asleep in 20 minutes, or awaken and not able to get back to sleep in 20 minutes, out of bed to somewhere quiet and comfortable and read.  No TV, phone, computer  Cut out caffeine and chocolate after noon/lunch  No daytime napping  Pick a time (sounds like morning) to get outside and walk or whatever, but need daylight and regular physical activity--easy to combine both--for sleep.  Goal #3:  Improve Diet  Family meeting to discuss everyone pitching in to help out with getting life more organized and less stressful  Kids to work on a Education officer, environmentalregular revolving menu for dinner and take turns getting it started, asking for ingredients, setting table, cleaning up, etc.  Here's the diet you read about every day:  Drink a glass of water before every meal Drink 6-8 glasses of water daily Eat three meals daily Eat a protein and healthy fat with every meal (eggs,fish, chicken, [place]key and limit red meats) Eat 5 servings of vegetables daily, mix the colors Eat 2 servings of fruit daily with skin, if skin is edible Use smaller plates Put food/utensils down as you chew and swallow each bite Eat at a table with friends/family at least once daily, no TV Do not eat in front of the TV  Recent studies show that people who consume all of their calories in a 12 hour period lose weight more efficiently.  For example, if you eat your first meal at 7:00 a.m., your last meal of the day should be completed by 7:00 p.m.   Gradually increase Metamucil as long as helping.

## 2018-03-06 ENCOUNTER — Telehealth: Payer: Self-pay | Admitting: Internal Medicine

## 2018-03-06 MED ORDER — FLUCONAZOLE 150 MG PO TABS: ORAL_TABLET | ORAL | 0 refills | Status: DC

## 2018-03-06 NOTE — Telephone Encounter (Signed)
Patient asking for Diflucan to be called into [facility] on [address]. Patient with yest infection and has done OTC monistat  Which helped but not completely gone.  To Dr. Delrae Alfred to send to pharmacy

## 2018-03-06 NOTE — Telephone Encounter (Signed)
Fluconazole Rx sent.

## 2018-07-01 ENCOUNTER — Telehealth: Payer: Self-pay | Admitting: Internal Medicine

## 2018-07-01 ENCOUNTER — Other Ambulatory Visit: Payer: Self-pay

## 2018-07-01 ENCOUNTER — Other Ambulatory Visit (INDEPENDENT_AMBULATORY_CARE_PROVIDER_SITE_OTHER): Payer: Self-pay

## 2018-07-01 DIAGNOSIS — R350 Frequency of micturition: Secondary | ICD-10-CM

## 2018-07-01 LAB — POCT URINALYSIS DIPSTICK
Glucose, UA: NEGATIVE
Ketones, UA: NEGATIVE
Nitrite, UA: NEGATIVE
Protein, UA: POSITIVE — AB
Spec Grav, UA: >=1.03 — AB (ref 1.010–1.025)
Urobilinogen, UA: 0.2 E.U./dL
pH, UA: 6 (ref 5.0–8.0)

## 2018-07-01 MED ORDER — CIPROFLOXACIN HCL 500 MG PO TABS: ORAL_TABLET | ORAL | 0 refills | Status: DC

## 2018-07-01 NOTE — Telephone Encounter (Signed)
3 noted opened today for the same concern.   Unable to reaccess my telephone note Treating for presumptive UTI. Cipro 500 mg twice daily for for 7 days. Sending urine for culture

## 2018-07-01 NOTE — Telephone Encounter (Signed)
Complains of low back pain, mild hematuria, frequency and amber color urine with odor starting yesterday.  No fever.   Urinates before and after sex UA with + Leuk, - nitrites.  Large blood. Not on period.  History of BTL No allergies

## 2018-07-01 NOTE — Telephone Encounter (Signed)
Patient with Back pain, frequency and blood with urination. Back pain x 2 days and frequency and blood since this morning. Patient denies any burning or discharge. Please call in antibiotic to [facility] on [address].  To Dr. Delrae Alfred for approval.

## 2018-07-03 LAB — URINE CULTURE

## 2018-11-03 ENCOUNTER — Other Ambulatory Visit (INDEPENDENT_AMBULATORY_CARE_PROVIDER_SITE_OTHER): Payer: Self-pay

## 2018-11-03 DIAGNOSIS — N3001 Acute cystitis with hematuria: Secondary | ICD-10-CM

## 2018-11-03 LAB — POCT URINALYSIS DIPSTICK
Glucose, UA: NEGATIVE
Ketones, UA: NEGATIVE
Nitrite, UA: NEGATIVE
Protein, UA: POSITIVE — AB
Spec Grav, UA: >=1.03 — AB (ref 1.010–1.025)
Urobilinogen, UA: 0.2 E.U./dL
pH, UA: 6 (ref 5.0–8.0)

## 2018-11-03 MED ORDER — CIPROFLOXACIN HCL 500 MG PO TABS: ORAL_TABLET | ORAL | 0 refills | Status: DC

## 2018-11-03 MED ORDER — PHENAZOPYRIDINE HCL 200 MG PO TABS: 200.0000 mg | ORAL_TABLET | Freq: Three times a day (TID) | ORAL | 0 refills | Status: DC | PRN

## 2018-11-03 NOTE — Progress Notes (Signed)
Complaining of dysuria and mild low back pain.  No fever. UA with large microscopic hematuria, +++ prot, Moderate leuks.  Neg Nitrite. Sending for culture. Cipro 500 mg twice daily for 7 days.

## 2018-11-05 LAB — URINE CULTURE

## 2018-11-06 ENCOUNTER — Telehealth: Payer: Self-pay | Admitting: Internal Medicine

## 2018-11-06 MED ORDER — ONDANSETRON 8 MG PO TBDP: 8.0000 mg | ORAL_TABLET | Freq: Three times a day (TID) | ORAL | 0 refills | Status: DC | PRN

## 2018-11-06 NOTE — Telephone Encounter (Signed)
Vomiting yesterday and throughout the night.  Little to eat yesterday. Tried gatorade overnight with frequent sips. Started this after holding all oral intake for 1 hour and unable to keep anything down On Cipro for possible UTI--Urine showed microscopic hematuria 2 days ago and + leuks, but culture returned negative yesterday Continues to have urinary urge and frequency. Back pain resolved No fever, diarrhea, abdominal pain, cough, sore throat or congestion. Does not feel badly, just cannot keep anything down. Sending in Rx for Zofran She is to call to be seen this afternoon if no improvement

## 2018-11-08 ENCOUNTER — Encounter (HOSPITAL_COMMUNITY): Payer: Self-pay

## 2018-11-08 ENCOUNTER — Emergency Department (HOSPITAL_COMMUNITY): Payer: Self-pay

## 2018-11-08 ENCOUNTER — Other Ambulatory Visit: Payer: Self-pay

## 2018-11-08 ENCOUNTER — Emergency Department (HOSPITAL_COMMUNITY)
Admission: EM | Admit: 2018-11-08 | Discharge: 2018-11-08 | Disposition: A | Discharge status: Home / Self Care | Payer: Self-pay | Attending: Emergency Medicine | Admitting: Emergency Medicine

## 2018-11-08 DIAGNOSIS — R112 Nausea with vomiting, unspecified: Secondary | ICD-10-CM | POA: Insufficient documentation

## 2018-11-08 DIAGNOSIS — R300 Dysuria: Secondary | ICD-10-CM | POA: Insufficient documentation

## 2018-11-08 DIAGNOSIS — J45909 Unspecified asthma, uncomplicated: Secondary | ICD-10-CM | POA: Insufficient documentation

## 2018-11-08 DIAGNOSIS — R1084 Generalized abdominal pain: Secondary | ICD-10-CM | POA: Insufficient documentation

## 2018-11-08 DIAGNOSIS — K5900 Constipation, unspecified: Secondary | ICD-10-CM | POA: Insufficient documentation

## 2018-11-08 DIAGNOSIS — F17210 Nicotine dependence, cigarettes, uncomplicated: Secondary | ICD-10-CM | POA: Insufficient documentation

## 2018-11-08 LAB — COMPREHENSIVE METABOLIC PANEL
ALT: 9 U/L (ref 0–44)
AST: 10 U/L — ABNORMAL LOW (ref 15–41)
Albumin: 3.6 g/dL (ref 3.5–5.0)
Alkaline Phosphatase: 47 U/L (ref 38–126)
Anion gap: 4 — ABNORMAL LOW (ref 5–15)
BUN: 13 mg/dL (ref 6–20)
CO2: 26 mmol/L (ref 22–32)
Calcium: 8.4 mg/dL — ABNORMAL LOW (ref 8.9–10.3)
Chloride: 109 mmol/L (ref 98–111)
Creatinine, Ser: 1.14 mg/dL — ABNORMAL HIGH (ref 0.44–1.00)
GFR calc Af Amer: >60 mL/min (ref >60)
GFR calc non Af Amer: >60 mL/min (ref >60)
Glucose, Bld: 109 mg/dL — ABNORMAL HIGH (ref 70–99)
Potassium: 3.9 mmol/L (ref 3.5–5.1)
Sodium: 139 mmol/L (ref 135–145)
Total Bilirubin: 0.6 mg/dL (ref 0.3–1.2)
Total Protein: 6.9 g/dL (ref 6.5–8.1)

## 2018-11-08 LAB — CBC WITH DIFFERENTIAL/PLATELET
Abs Immature Granulocytes: 0.01 10*3/uL (ref 0.00–0.07)
Basophils Absolute: 0.1 10*3/uL (ref 0.0–0.1)
Basophils Relative: 1 %
Eosinophils Absolute: 0.3 10*3/uL (ref 0.0–0.5)
Eosinophils Relative: 4 %
HCT: 38.1 % (ref 36.0–46.0)
Hemoglobin: 11.8 g/dL — ABNORMAL LOW (ref 12.0–15.0)
Immature Granulocytes: 0 %
Lymphocytes Relative: 21 %
Lymphs Abs: 1.5 10*3/uL (ref 0.7–4.0)
MCH: 25.7 pg — ABNORMAL LOW (ref 26.0–34.0)
MCHC: 31 g/dL (ref 30.0–36.0)
MCV: 83 fL (ref 80.0–100.0)
Monocytes Absolute: 0.7 10*3/uL (ref 0.1–1.0)
Monocytes Relative: 9 %
Neutro Abs: 4.7 10*3/uL (ref 1.7–7.7)
Neutrophils Relative %: 65 %
Platelets: 310 10*3/uL (ref 150–400)
RBC: 4.59 MIL/uL (ref 3.87–5.11)
RDW: 13.1 % (ref 11.5–15.5)
WBC: 7.2 10*3/uL (ref 4.0–10.5)
nRBC: 0 % (ref 0.0–0.2)

## 2018-11-08 LAB — URINALYSIS, ROUTINE W REFLEX MICROSCOPIC
Bilirubin Urine: NEGATIVE
Glucose, UA: NEGATIVE mg/dL
Hgb urine dipstick: NEGATIVE
Ketones, ur: NEGATIVE mg/dL
Leukocytes,Ua: NEGATIVE
Nitrite: NEGATIVE
Protein, ur: NEGATIVE mg/dL
Specific Gravity, Urine: 1.008 (ref 1.005–1.030)
pH: 7 (ref 5.0–8.0)

## 2018-11-08 LAB — I-STAT BETA HCG BLOOD, ED (MC, WL, AP ONLY): I-stat hCG, quantitative: <5 m[IU]/mL (ref <5)

## 2018-11-08 LAB — LIPASE, BLOOD: Lipase: 20 U/L (ref 11–51)

## 2018-11-08 MED ORDER — PROMETHAZINE HCL 25 MG/ML IJ SOLN
25.0000 mg | Freq: Once | INTRAMUSCULAR | Status: AC
  Administered 2018-11-08: 14:00:00 25 mg via INTRAVENOUS
  Filled 2018-11-08: qty 1

## 2018-11-08 MED ORDER — FENTANYL CITRATE (PF) 100 MCG/2ML IJ SOLN
100.0000 ug | Freq: Once | INTRAMUSCULAR | Status: AC
  Administered 2018-11-08: 12:00:00 100 ug via INTRAVENOUS
  Filled 2018-11-08: qty 2

## 2018-11-08 MED ORDER — ACETAMINOPHEN 500 MG PO TABS
1000.0000 mg | ORAL_TABLET | Freq: Once | ORAL | Status: AC
  Administered 2018-11-08: 11:00:00 1000 mg via ORAL
  Filled 2018-11-08: qty 2

## 2018-11-08 MED ORDER — ONDANSETRON HCL 4 MG/2ML IJ SOLN
4.0000 mg | Freq: Once | INTRAMUSCULAR | Status: AC
  Administered 2018-11-08: 4 mg via INTRAVENOUS
  Filled 2018-11-08: qty 2

## 2018-11-08 MED ORDER — SODIUM CHLORIDE 0.9 % IV BOLUS
1000.0000 mL | Freq: Once | INTRAVENOUS | Status: AC
  Administered 2018-11-08: 09:00:00 1000 mL via INTRAVENOUS

## 2018-11-08 MED ORDER — PROMETHAZINE HCL 25 MG RE SUPP: 25.0000 mg | Freq: Four times a day (QID) | RECTAL | 0 refills | Status: DC | PRN

## 2018-11-08 MED ORDER — POLYETHYLENE GLYCOL 3350 17 GM/SCOOP PO POWD: 17.0000 g | Freq: Every day | ORAL | 0 refills | Status: DC

## 2018-11-08 MED ORDER — GLYCERIN (ADULT) 2 G RE SUPP: 1.0000 | RECTAL | 0 refills | Status: DC | PRN

## 2018-11-08 NOTE — Discharge Instructions (Signed)
Please read and follow all provided instructions.  Your diagnoses today include:  1. Non-intractable vomiting with nausea, unspecified vomiting type   2. Constipation, unspecified constipation type     Tests performed today include:  Blood counts and electrolytes  Blood tests to check liver and kidney function - slightly high kidnye function  Blood tests to check pancreas function  Urine test to look for infection - no sign of infection today  CT scan - did not show any definite signs of kidney infection or kidney stone, you did have a small left renal cyst, you have a lot of stool in your large bowel which could indicate constipation.  Vital signs. See below for your results today.   Medications prescribed:   Phenergan (promethazine) - for nausea and vomiting   Miralax - laxative  This medication can be found over-the-counter.   Take any prescribed medications only as directed.  Home care instructions:   Follow any educational materials contained in this packet.  Follow-up instructions: Please follow-up with your primary care provider in the next 3 days for further evaluation of your symptoms.    Return instructions:  SEEK IMMEDIATE MEDICAL ATTENTION IF:  The pain does not go away or becomes severe   A temperature above 101F develops   Repeated vomiting occurs (multiple episodes)   The pain becomes localized to portions of the abdomen. The right side could possibly be appendicitis. In an adult, the left lower portion of the abdomen could be colitis or diverticulitis.   Blood is being passed in stools or vomit (bright red or black tarry stools)   You develop chest pain, difficulty breathing, dizziness or fainting, or become confused, poorly responsive, or inconsolable (young children)  If you have any other emergent concerns regarding your health  Additional Information: Abdominal (belly) pain can be caused by many things. Your caregiver performed an  examination and possibly ordered blood/urine tests and imaging (CT scan, x-rays, ultrasound). Many cases can be observed and treated at home after initial evaluation in the emergency department. Even though you are being discharged home, abdominal pain can be unpredictable. Therefore, you need a repeated exam if your pain does not resolve, returns, or worsens. Most patients with abdominal pain don't have to be admitted to the hospital or have surgery, but serious problems like appendicitis and gallbladder attacks can start out as nonspecific pain. Many abdominal conditions cannot be diagnosed in one visit, so follow-up evaluations are very important.  Your vital signs today were: BP 114/75 (BP Location: Right Arm)    Pulse 65    Temp 98.7 F (37.1 C) (Oral)    Resp 16    Ht 5\' 4"  (1.626 m)    Wt 81.2 kg    LMP 10/21/2018    SpO2 100%    BMI 30.73 kg/m  If your blood pressure (bp) was elevated above 135/85 this visit, please have this repeated by your doctor within one month. --------------

## 2018-11-08 NOTE — ED Triage Notes (Addendum)
Pt states she has been having both sides back pain and abd pain for several days. Pt works at [employer] office, and had UA that showed hematuria and leuks. Pt was given cipro, but has been unable to keep meds down. Pt states she has had 30 episodes of emesis since Thursday.  Denies diarrhea, fever.  Pt states every time she vomits, she feels the need to pee.

## 2018-11-08 NOTE — ED Notes (Signed)
Urine culture sent to lab with urine sample. Pt requesting pain medication for her back. RN/PA/MD aware.

## 2018-11-08 NOTE — ED Notes (Signed)
Pt given ginger ale at this time for PO fluid challenge.  

## 2018-11-08 NOTE — ED Provider Notes (Signed)
[facility] DEPT Provider Note   CSN: [id number] Arrival date & time: 11/08/18  6387     History   Chief Complaint Chief Complaint  Patient presents with   Urinary Tract Infection   Back Pain   Emesis    HPI Kylie Rivas is a 38 y.o. female.     Patient presents to the emergency department today with complaint of vomiting, irritative urinary symptoms, and back pain.  Patient states that she had some dysuria and increased frequency approximately 2 weeks ago.  This gradually improved but returned last week.  Patient works at a [employer] and did a urine dipstick.  She states that this showed blood, white blood cells, protein, and bilirubin.  Patient was started on ciprofloxacin.  She took approximately 2 days of this but 3 days ago developed persistent vomiting.  She has been able to hold down fluids and medications inconsistently.  She has not had a dose of Cipro in a couple of days now.  Patient has pain that has moved up to her bilateral lower back.  She denies any vaginal bleeding or discharge.  She denies fever, chest pain, or shortness of breath.  She has been taking Zofran at home but continues to vomit.  Onset of symptoms acute.  Course is constant.  No history of abdominal surgeries other than hernia repair.  No history of bowel obstruction.     Past Medical History:  Diagnosis Date   Allergy    Bee stings, shellfish   Asthma    winter time.  Last episode 2017   Stomach ulcer     Patient Active Problem List   Diagnosis Date Noted   Asthma    Allergy     Past Surgical History:  Procedure Laterality Date   ANKLE SURGERY Right 2003   ligament repair   ANKLE SURGERY Left 2009   LIgament repair   HERNIA REPAIR  5643   umbilical   TUBAL LIGATION  2010     OB History   No obstetric history on file.      Home Medications    Prior to Admission medications   Medication Sig Start Date End Date Taking?  Authorizing Provider  ciprofloxacin (CIPRO) 500 MG tablet 1 tab by mouth twice daily for 7 days 11/03/18   Mack Hook, MD  dicyclomine (BENTYL) 20 MG tablet 1 tab by mouth every 6 hours as needed for cramping 01/09/18   Mack Hook, MD  mupirocin ointment (BACTROBAN) 2 % Place 1 application into the nose 2 (two) times daily. 01/16/18   Mack Hook, MD  ondansetron (ZOFRAN-ODT) 8 MG disintegrating tablet Take 1 tablet (8 mg total) by mouth every 8 (eight) hours as needed for nausea or vomiting. 11/06/18   Mack Hook, MD  phenazopyridine (PYRIDIUM) 200 MG tablet Take 1 tablet (200 mg total) by mouth 3 (three) times daily as needed for pain. 11/03/18   Mack Hook, MD    Family History Family History  Problem Relation Age of Onset   Diabetes Mother    Hypertension Mother    Seizures Mother        due to strokes in past   Stroke Mother    Kidney disease Mother        recurrent kidney stones--unclear what type   Heart disease Mother        Ischemic Heart Disease   Pulmonary embolism Mother        unknown clotting disorder.  Patient states  she was tested for same and did not have.   Diabetes Father    Heart disease Father        MI   Stroke Father    Asthma Sister    Hypertension Sister    Cancer Brother        Primary lung, but met to nasopharyngeal area.    Social History Social History   Tobacco Use   Smoking status: Current Some Day Smoker    Types: Cigarettes   Smokeless tobacco: Never Used  Substance Use Topics   Alcohol use: Yes    Comment: socially   Drug use: No     Allergies   Aspirin and Iodinated diagnostic agents   Review of Systems Review of Systems  Constitutional: Negative for fever.  HENT: Negative for rhinorrhea and sore throat.   Eyes: Negative for redness.  Respiratory: Negative for cough.   Cardiovascular: Negative for chest pain.  Gastrointestinal: Positive for abdominal pain, constipation,  nausea and vomiting. Negative for diarrhea.  Genitourinary: Positive for dysuria, frequency and urgency.  Musculoskeletal: Positive for back pain. Negative for myalgias.  Skin: Negative for rash.  Neurological: Negative for headaches.     Physical Exam Updated Vital Signs BP 121/84    Pulse 70    Temp 98.7 F (37.1 C) (Oral)    Resp 16    Ht '5\' 4"'$  (1.626 m)    Wt 81.2 kg    LMP 10/21/2018    SpO2 100%    BMI 30.73 kg/m   Physical Exam Vitals signs and nursing note reviewed.  Constitutional:      Appearance: She is well-developed.  HENT:     Head: Normocephalic and atraumatic.  Eyes:     General:        Right eye: No discharge.        Left eye: No discharge.     Conjunctiva/sclera: Conjunctivae normal.  Neck:     Musculoskeletal: Normal range of motion and neck supple.  Cardiovascular:     Rate and Rhythm: Normal rate and regular rhythm.     Heart sounds: Normal heart sounds.  Pulmonary:     Effort: Pulmonary effort is normal.     Breath sounds: Normal breath sounds.  Abdominal:     Palpations: Abdomen is soft.     Tenderness: There is abdominal tenderness. There is right CVA tenderness and left CVA tenderness.     Comments: Patient with mild suprapubic tenderness to deep palpation.  No rebound or guarding.  Skin:    General: Skin is warm and dry.  Neurological:     Mental Status: She is alert.      ED Treatments / Results  Labs (all labs ordered are listed, but only abnormal results are displayed) Labs Reviewed  CBC WITH DIFFERENTIAL/PLATELET - Abnormal; Notable for the following components:      Result Value   Hemoglobin 11.8 (*)    MCH 25.7 (*)    All other components within normal limits  COMPREHENSIVE METABOLIC PANEL - Abnormal; Notable for the following components:   Glucose, Bld 109 (*)    Creatinine, Ser 1.14 (*)    Calcium 8.4 (*)    AST 10 (*)    Anion gap 4 (*)    All other components within normal limits  URINALYSIS, ROUTINE W REFLEX MICROSCOPIC  - Abnormal; Notable for the following components:   APPearance HAZY (*)    All other components within normal limits  LIPASE, BLOOD  I-STAT  BETA HCG BLOOD, ED ([facility], [facility], [facility] ONLY)    EKG None  Radiology Ct Renal Stone Study  Result Date: 11/08/2018 CLINICAL DATA:  Bilateral flank pain worse on the right. Microscopic hematuria. Nausea and vomiting. EXAM: CT ABDOMEN AND PELVIS WITHOUT CONTRAST TECHNIQUE: Multidetector CT imaging of the abdomen and pelvis was performed following the standard protocol without IV contrast. COMPARISON:  CT abdomen pelvis 03/07/2015 FINDINGS: Lower chest: Minimal bibasilar atelectasis. Evaluation of the abdominal organs somewhat limited by the lack of IV contrast. Hepatobiliary: No focal liver abnormality is seen. No gallstones, gallbladder wall thickening, or biliary dilatation. Pancreas: Unremarkable. No surrounding inflammatory changes. Spleen: Normal in size without focal abnormality. Adrenals/Urinary Tract: Adrenal glands are unremarkable. Kidneys are symmetric in size. No hydronephrosis. Left renal cyst. No renal calculi identified. Bladder is unremarkable. Stomach/Bowel: Stomach is within normal limits. Appendix appears normal. No evidence of bowel wall thickening, distention, or inflammatory changes. Scattered colonic diverticula without evidence of diverticulitis. Vascular/Lymphatic: No significant vascular findings are present. No enlarged abdominal or pelvic lymph nodes. Reproductive: Postoperative changes along the fallopian tubes bilaterally. The uterus is unremarkable. Other: No abdominal wall hernia or abnormality. No abdominopelvic ascites. Musculoskeletal: No acute or significant osseous findings. IMPRESSION: 1. No evidence of renal calculi. 2. No CT evidence of acute intra-abdominal process within the limitations of a noncontrast study. Electronically Signed   By: Audie Pinto M.D.   On: 11/08/2018 12:53    Procedures Procedures (including critical care  time)  Medications Ordered in ED Medications  sodium chloride 0.9 % bolus 1,000 mL (0 mLs Intravenous Stopped 11/08/18 1032)  ondansetron (ZOFRAN) injection 4 mg (4 mg Intravenous Given 11/08/18 0915)  acetaminophen (TYLENOL) tablet 1,000 mg (1,000 mg Oral Given 11/08/18 1032)  fentaNYL (SUBLIMAZE) injection 100 mcg (100 mcg Intravenous Given 11/08/18 1155)  promethazine (PHENERGAN) injection 25 mg (25 mg Intravenous Given 11/08/18 1428)     Initial Impression / Assessment and Plan / ED Course  I have reviewed the triage vital signs and the nursing notes.  Pertinent labs & imaging results that were available during my care of the patient were reviewed by me and considered in my medical decision making (see chart for details).        Patient seen and examined. Work-up initiated. Medications ordered.  Per history, symptoms are suggestive of pyelonephritis with back pain, irritative urinary tract symptoms, and vomiting.  Patient has not had any fevers.  She appears well, nontoxic.  Will give IV hydration and check lab work, urine.  Patient in agreement.  Patient's abdominal exam is reassuring with only mild tenderness over the suprapubic area.  Vital signs reviewed and are as follows: BP 121/84    Pulse 70    Temp 98.7 F (37.1 C) (Oral)    Resp 16    Ht '5\' 4"'$  (1.626 m)    Wt 81.2 kg    LMP 10/21/2018    SpO2 100%    BMI 30.73 kg/m    11:43 AM patient reevaluated.  She continues to have pain, improved with lying on her side.  Work-up today is reassuring.  I had a shared decision making discussion with the patient regarding CT imaging to look for signs of kidney stone or other potential problems.  Patient agrees and wishes to proceed with imaging here today.  Will give additional pain medication.  We will also p.o. challenge after results of scan.  1:54 PM scan reviewed.  No acute findings.  Patient informed of likely benign left  renal cyst.  She does have a large amount of stool in the colon on  my review of the CT images.  It is possible that some of her symptoms are related to constipation.  Will give additional medicine for nausea and p.o. challenge here.  Patient is comfortable with going home with bowel regimen and continuing the antibiotics for her UTI.  2:56 PM patient is tolerating ginger ale well.  She is comfortable discharged home at this time.  Will give MiraLAX and glycerin suppositories to treat constipation.  Will give prescription for Phenergan suppositories if she has continued vomiting, otherwise she will use home oral Phenergan prescribed by her doctor.  The patient was urged to return to the Emergency Department immediately with worsening of current symptoms, worsening abdominal pain, persistent vomiting, blood noted in stools, fever, or any other concerns. The patient verbalized understanding.     Final Clinical Impressions(s) / ED Diagnoses   Final diagnoses:  Non-intractable vomiting with nausea, unspecified vomiting type  Constipation, unspecified constipation type   Patient presents with multiple sides of nausea and vomiting, back pain.  Patient had a urine which was concerning for UTI earlier in the week.  She has taken several doses of Cipro prescribed by her doctor.  Fortunately her urine looks improved today.  She is not febrile.  Symptoms controlled in the emergency department after antiemetics and fluids.  Given her back pain and hematuria earlier in the week, CT ordered to evaluate for renal stone or pyelonephritis.  Imaging was reassuring.  Patient does have stool throughout her entire bowel and has been constipated recently.  It is possible that this is contributing to her symptoms and she will be treated for constipation.  Return instructions as above.  She works at a [employer] and will have close follow-up.  Patient has a mild AKI which was treated in the emergency department today with IV fluids.  Given that she is now tolerating oral fluids, no  further intervention for this.   ED Discharge Orders         Ordered    promethazine (PHENERGAN) 25 MG suppository  Every 6 hours PRN     11/08/18 1455    glycerin adult 2 g suppository  As needed     11/08/18 1455    polyethylene glycol powder (GLYCOLAX/MIRALAX) 17 GM/SCOOP powder  Daily     11/08/18 1455           Carlisle Cater, PA-C 11/08/18 1503    Daleen Bo, MD 11/10/18 [id number]

## 2019-01-21 ENCOUNTER — Other Ambulatory Visit: Payer: Self-pay

## 2019-01-21 ENCOUNTER — Other Ambulatory Visit (INDEPENDENT_AMBULATORY_CARE_PROVIDER_SITE_OTHER): Payer: Self-pay

## 2019-01-21 DIAGNOSIS — X58XXXA Exposure to other specified factors, initial encounter: Secondary | ICD-10-CM

## 2019-01-22 LAB — HEPATITIS C ANTIBODY: Hep C Virus Ab: <0.1 s/co ratio (ref 0.0–0.9)

## 2019-01-22 LAB — HEPATITIS B SURFACE ANTIGEN: Hepatitis B Surface Ag: NEGATIVE

## 2019-01-22 LAB — HIV ANTIBODY (ROUTINE TESTING W REFLEX): HIV Screen 4th Generation wRfx: NONREACTIVE

## 2019-01-22 LAB — HEPATITIS B CORE ANTIBODY, TOTAL: Hep B Core Total Ab: NEGATIVE

## 2019-01-22 LAB — HEPATITIS B SURFACE ANTIBODY,QUALITATIVE: Hep B Surface Ab, Qual: NONREACTIVE

## 2019-02-11 ENCOUNTER — Other Ambulatory Visit (INDEPENDENT_AMBULATORY_CARE_PROVIDER_SITE_OTHER): Payer: Self-pay

## 2019-02-11 DIAGNOSIS — Z9189 Other specified personal risk factors, not elsewhere classified: Secondary | ICD-10-CM

## 2019-02-13 LAB — NOVEL CORONAVIRUS, NAA: SARS-CoV-2, NAA: NOT DETECTED

## 2019-03-08 ENCOUNTER — Other Ambulatory Visit: Payer: Self-pay

## 2019-03-08 ENCOUNTER — Other Ambulatory Visit: Payer: Self-pay | Admitting: Internal Medicine

## 2019-03-08 DIAGNOSIS — X58XXXD Exposure to other specified factors, subsequent encounter: Secondary | ICD-10-CM

## 2019-03-08 NOTE — Progress Notes (Signed)
6 week post-exposure. Low risk needle stick.

## 2019-03-09 LAB — HEPATITIS B SURFACE ANTIGEN: Hepatitis B Surface Ag: NEGATIVE

## 2019-03-09 LAB — HEPATITIS A ANTIBODY, TOTAL: hep A Total Ab: NEGATIVE

## 2019-03-09 LAB — HEPATITIS B CORE ANTIBODY, TOTAL: Hep B Core Total Ab: NEGATIVE

## 2019-03-09 LAB — HEPATITIS C ANTIBODY: Hep C Virus Ab: <0.1 s/co ratio (ref 0.0–0.9)

## 2019-03-09 LAB — HIV ANTIBODY (ROUTINE TESTING W REFLEX): HIV Screen 4th Generation wRfx: NONREACTIVE

## 2019-03-09 LAB — HEPATITIS B SURFACE ANTIBODY,QUALITATIVE: Hep B Surface Ab, Qual: NONREACTIVE

## 2019-03-24 ENCOUNTER — Encounter: Payer: Self-pay | Admitting: Internal Medicine

## 2019-04-23 ENCOUNTER — Other Ambulatory Visit: Payer: Self-pay

## 2019-05-21 ENCOUNTER — Other Ambulatory Visit: Payer: Self-pay

## 2020-02-23 IMAGING — CT CT RENAL STONE PROTOCOL
2 of 4 series · 17 of 46 positions shown, 19 images · non-contrast
Comparison: CT abdomen pelvis 03/07/2015

CLINICAL DATA: Bilateral flank pain worse on the right. Microscopic
hematuria. Nausea and vomiting.

EXAM:
CT ABDOMEN AND PELVIS WITHOUT CONTRAST
TECHNIQUE: Multidetector CT imaging of the abdomen and pelvis was performed
following the standard protocol without IV contrast.

[Series 2: axial st · axial · 0.70mm/px · z∈[-446,-31]mm · 14 of 95 slices shown, 16 images]
[im 6/95  soft-tissue]
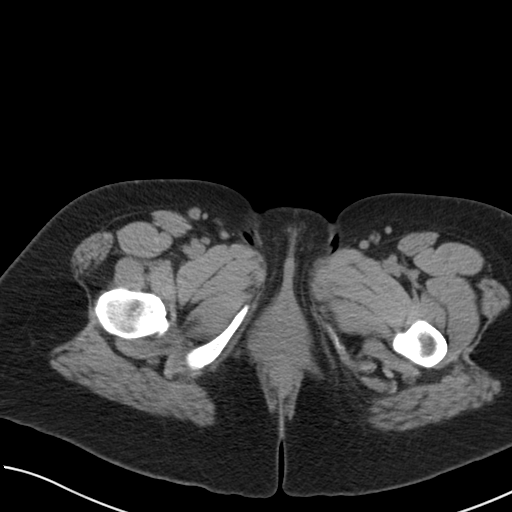
[im 6/95  bone]
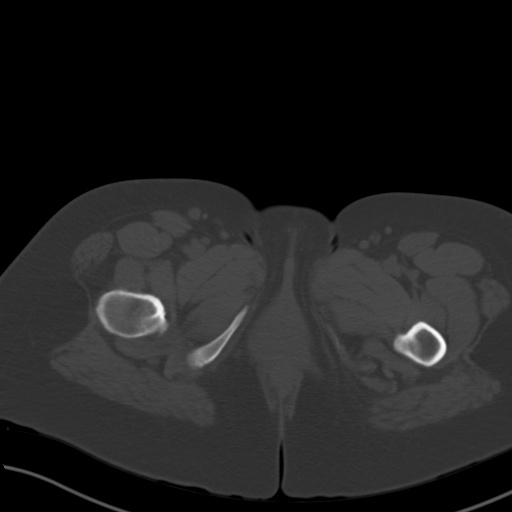
[im 11/95  soft-tissue]
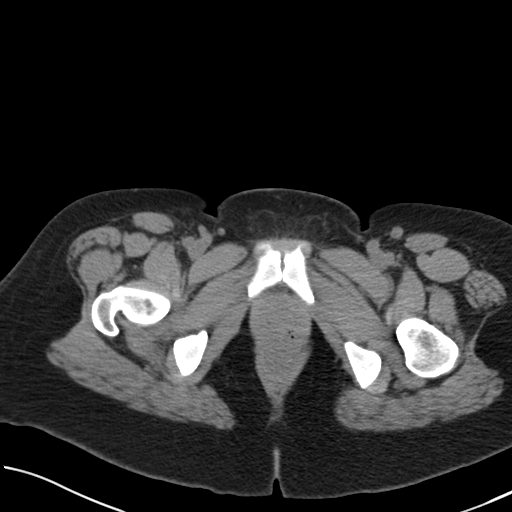
[im 21/95  soft-tissue]
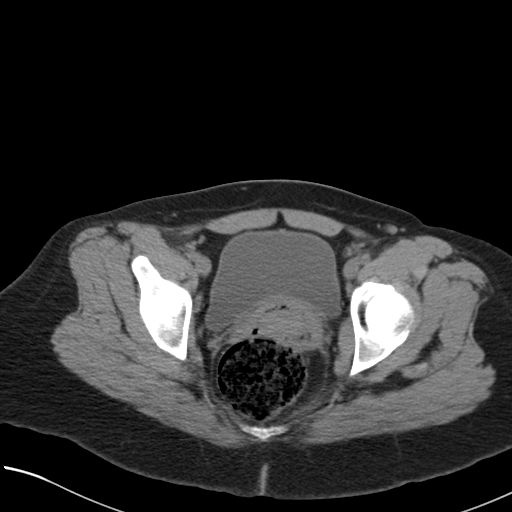
[im 27/95  soft-tissue]
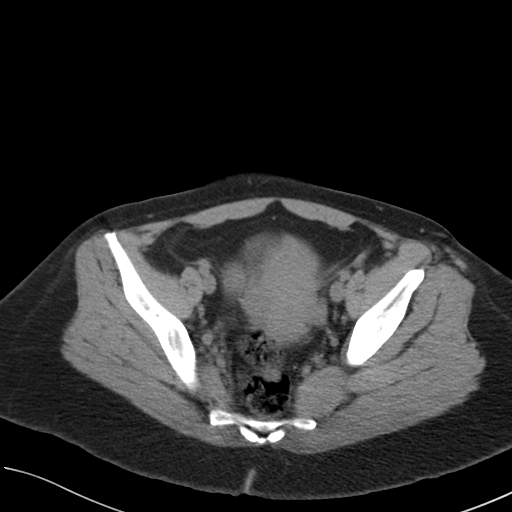
[im 32/95  soft-tissue]
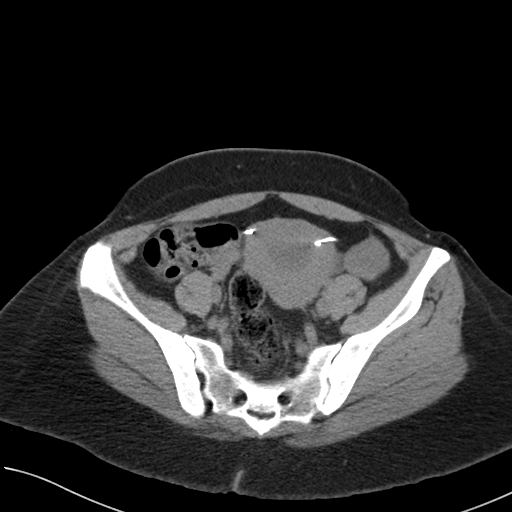
[im 37/95  soft-tissue]
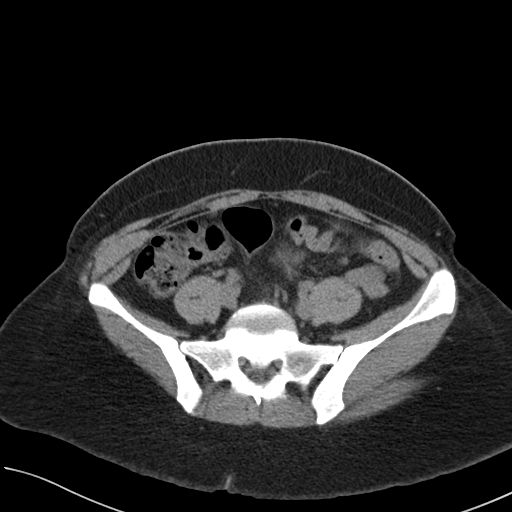
[im 42/95  soft-tissue]
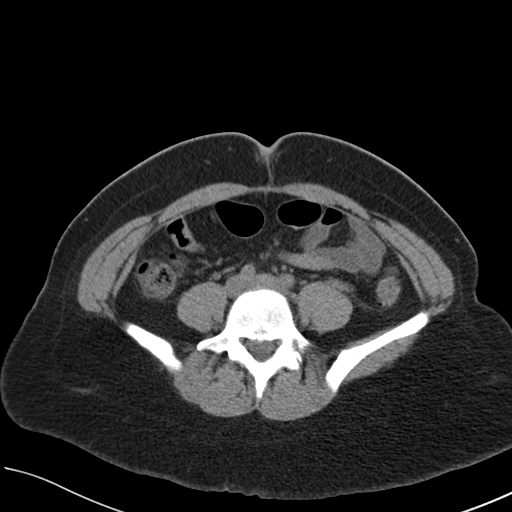
[im 53/95  soft-tissue]
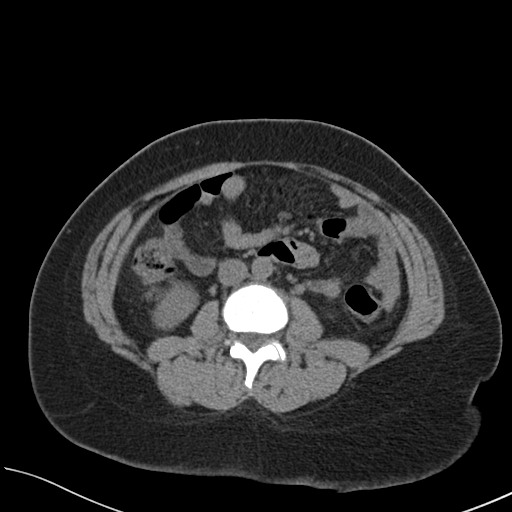
[im 58/95  soft-tissue]
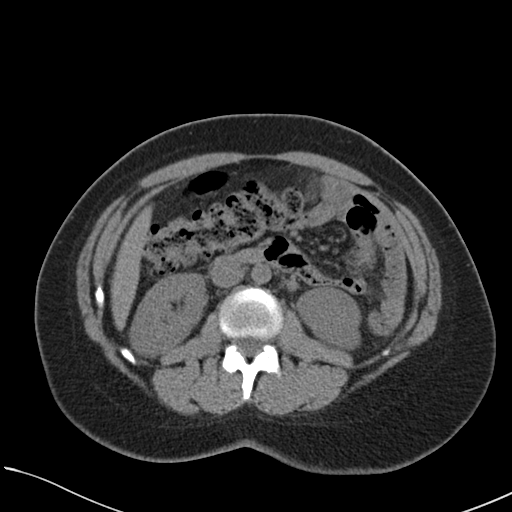
[im 58/95  bone]
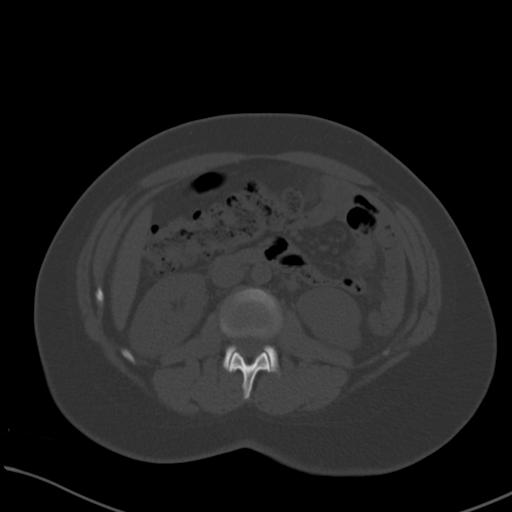
[im 63/95  soft-tissue]
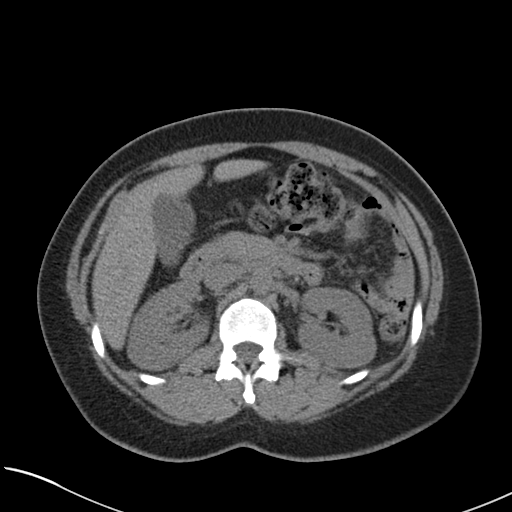
[im 68/95  soft-tissue]
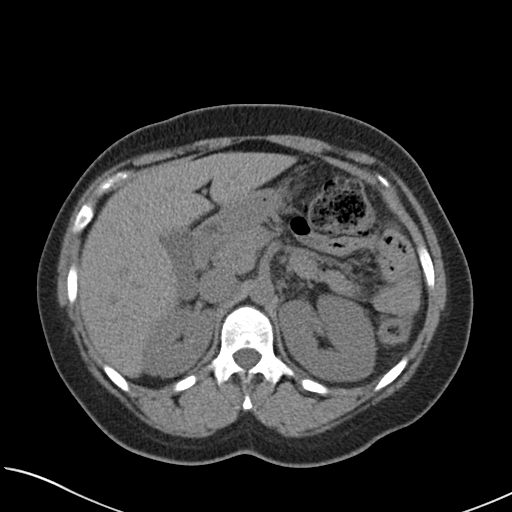
[im 74/95  soft-tissue]
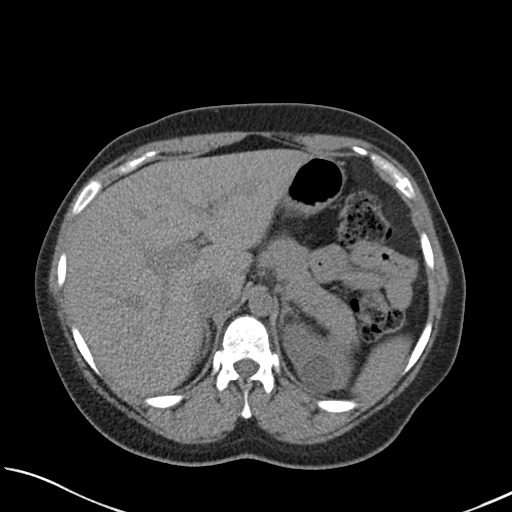
[im 84/95  soft-tissue]
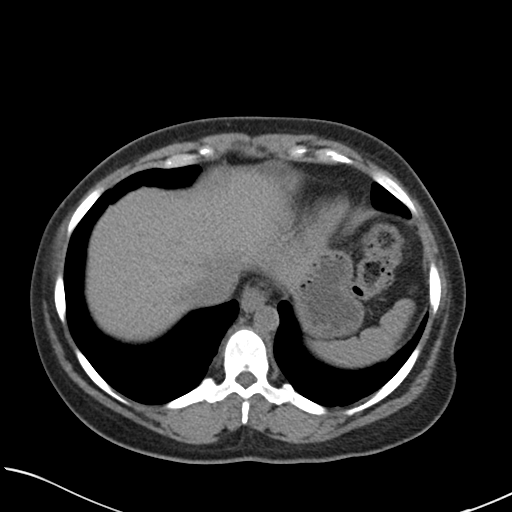
[im 89/95  soft-tissue]
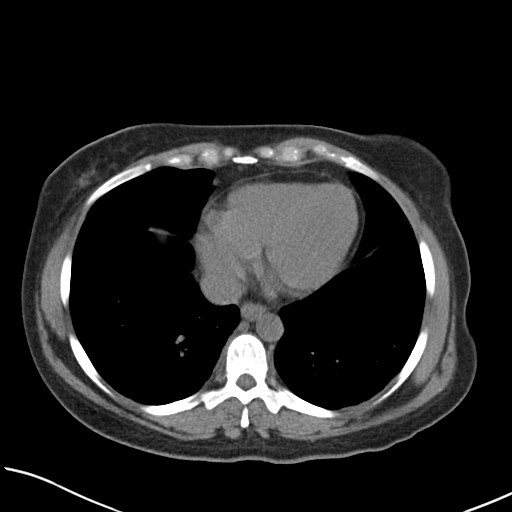

[Series 4: coronal · coronal · 0.92mm/px · 3 of 130 slices shown]
[im 44/130  soft-tissue]
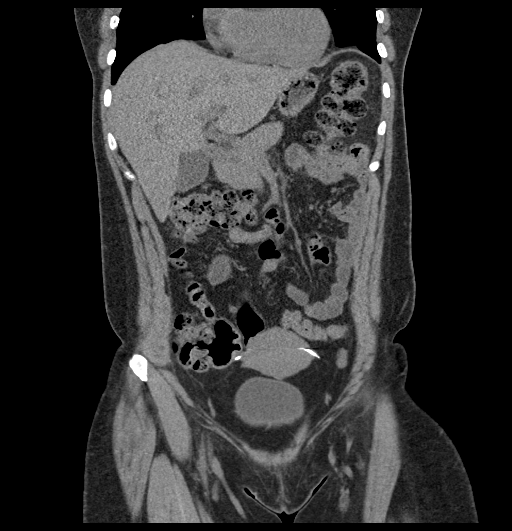
[im 58/130  soft-tissue]
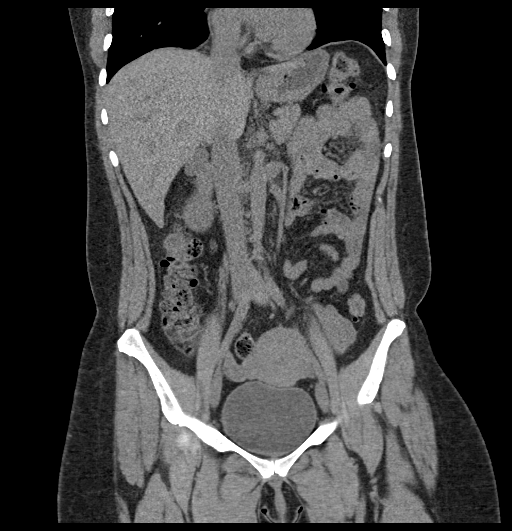
[im 72/130  soft-tissue]
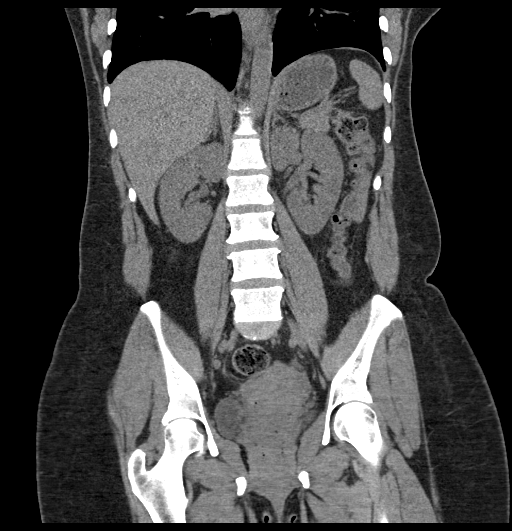

[17 of 46 positions shown; findings below may reference images not displayed]

FINDINGS: Lower chest: Minimal bibasilar atelectasis.

Evaluation of the abdominal organs somewhat limited by the lack of
IV contrast.

Hepatobiliary: No focal liver abnormality is seen. No gallstones,
gallbladder wall thickening, or biliary dilatation.

Pancreas: Unremarkable. No surrounding inflammatory changes.

Spleen: Normal in size without focal abnormality.

Adrenals/Urinary Tract: Adrenal glands are unremarkable. Kidneys are
symmetric in size. No hydronephrosis. Left renal cyst. No renal
calculi identified. Bladder is unremarkable.

Stomach/Bowel: Stomach is within normal limits. Appendix appears
normal. No evidence of bowel wall thickening, distention, or
inflammatory changes. Scattered colonic diverticula without evidence
of diverticulitis.

Vascular/Lymphatic: No significant vascular findings are present. No
enlarged abdominal or pelvic lymph nodes.

Reproductive: Postoperative changes along the fallopian tubes
bilaterally. The uterus is unremarkable.

Other: No abdominal wall hernia or abnormality. No abdominopelvic
ascites.

Musculoskeletal: No acute or significant osseous findings.
IMPRESSION: 1. No evidence of renal calculi.
2. No CT evidence of acute intra-abdominal process within the
limitations of a noncontrast study.

## 2020-08-08 ENCOUNTER — Ambulatory Visit: Payer: No Typology Code available for payment source | Admitting: Nurse Practitioner

## 2020-08-08 ENCOUNTER — Other Ambulatory Visit: Payer: Self-pay

## 2020-08-08 ENCOUNTER — Other Ambulatory Visit (HOSPITAL_COMMUNITY)
Admission: RE | Admit: 2020-08-08 | Discharge: 2020-08-08 | Disposition: A | Discharge status: Home / Self Care | Payer: No Typology Code available for payment source | Source: Ambulatory Visit | Attending: Nurse Practitioner | Admitting: Nurse Practitioner

## 2020-08-08 ENCOUNTER — Encounter: Payer: Self-pay | Admitting: Nurse Practitioner

## 2020-08-08 VITALS — BP 128/74 | HR 73 | Temp 98.5°F | Ht 67.0 in | Wt 175.4 lb

## 2020-08-08 DIAGNOSIS — N926 Irregular menstruation, unspecified: Secondary | ICD-10-CM | POA: Diagnosis not present

## 2020-08-08 DIAGNOSIS — H6121 Impacted cerumen, right ear: Secondary | ICD-10-CM | POA: Diagnosis not present

## 2020-08-08 DIAGNOSIS — Z1231 Encounter for screening mammogram for malignant neoplasm of breast: Secondary | ICD-10-CM

## 2020-08-08 DIAGNOSIS — Z0000 Encounter for general adult medical examination without abnormal findings: Secondary | ICD-10-CM | POA: Diagnosis present

## 2020-08-08 DIAGNOSIS — E041 Nontoxic single thyroid nodule: Secondary | ICD-10-CM | POA: Diagnosis not present

## 2020-08-08 DIAGNOSIS — Z23 Encounter for immunization: Secondary | ICD-10-CM | POA: Diagnosis not present

## 2020-08-08 DIAGNOSIS — Z0001 Encounter for general adult medical examination with abnormal findings: Secondary | ICD-10-CM | POA: Diagnosis not present

## 2020-08-08 DIAGNOSIS — Z6827 Body mass index (BMI) 27.0-27.9, adult: Secondary | ICD-10-CM

## 2020-08-08 DIAGNOSIS — E559 Vitamin D deficiency, unspecified: Secondary | ICD-10-CM

## 2020-08-08 DIAGNOSIS — Z7689 Persons encountering health services in other specified circumstances: Secondary | ICD-10-CM

## 2020-08-08 DIAGNOSIS — Z13228 Encounter for screening for other metabolic disorders: Secondary | ICD-10-CM

## 2020-08-08 DIAGNOSIS — E663 Overweight: Secondary | ICD-10-CM

## 2020-08-08 NOTE — Progress Notes (Signed)
I,Tianna Badgett,acting as a Education administrator for [employer], NP.,have documented all relevant documentation on the behalf of [employer], NP,as directed by  Bary Castilla, NP while in the presence of Bary Castilla, NP.  This visit occurred during the SARS-CoV-2 public health emergency.  Safety protocols were in place, including screening questions prior to the visit, additional usage of staff PPE, and extensive cleaning of exam room while observing appropriate contact time as indicated for disinfecting solutions.  Subjective:     Patient ID: Kylie Rivas , female    DOB: 1980-11-17 , 40 y.o.   MRN: [id number]   Chief Complaint  Patient presents with  . Establish Care    HPI  Patient is here to establish care. She is a Psychologist, sport and exercise at [employer] The last time she had a PCP was a year ago. She was told in the pawst that she had a thyroid nodule but never had a [place] for it. She gets regular dentist visit and eye doctor. She also has a concern of irregular periods and would like to see a OBGYN for that.      Past Medical History:  Diagnosis Date  . Allergy    Bee stings, shellfish  . Asthma    winter time.  Last episode 2017  . Stomach ulcer      Family History  Problem Relation Age of Onset  . Diabetes Mother   . Hypertension Mother   . Seizures Mother        due to strokes in past  . Stroke Mother   . Kidney disease Mother        recurrent kidney stones--unclear what type  . Heart disease Mother        Ischemic Heart Disease  . Pulmonary embolism Mother        unknown clotting disorder.  Patient states she was tested for same and did not have.  . Diabetes Father   . Heart disease Father        MI  . Stroke Father   . Asthma Sister   . Hypertension Sister   . Cancer Brother        Primary lung, but met to nasopharyngeal area.     Current Outpatient Medications:  .  famotidine (PEPCID) 20 MG tablet, Take 20 mg by mouth 2 (two) times  daily., Disp: , Rfl:  .  fexofenadine (ALLEGRA) 60 MG tablet, Take 60 mg by mouth daily., Disp: , Rfl:  .  ibuprofen (ADVIL) 200 MG tablet, Take 400 mg by mouth every 6 (six) hours as needed., Disp: , Rfl:  .  Multiple Vitamin (MULTIVITAMIN WITH MINERALS) TABS tablet, Take 1 tablet by mouth daily., Disp: , Rfl:    Allergies  Allergen Reactions  . Aspirin Anaphylaxis  . Iodinated Diagnostic Agents Anaphylaxis  . Shellfish Allergy       The patient states she uses nothing for birth control. Last LMP was Patient's last menstrual period was 07/23/2020.. Negative for: breast discharge, breast lump(s), breast pain and breast self exam. Associated symptoms include abnormal vaginal bleeding. Pertinent negatives include abnormal bleeding (hematology), anxiety, decreased libido, depression, difficulty falling sleep, dyspareunia, history of infertility, nocturia, sexual dysfunction, sleep disturbances, urinary incontinence, urinary urgency, vaginal discharge and vaginal itching. Diet regular.The patient states her exercise level is    . The patient's tobacco use is:  Social History   Tobacco Use  Smoking Status Former Smoker  . Types: Cigarettes  . Quit date: 07/08/2017  . Years since  quitting: 3.0  Smokeless Tobacco Never Used  . She has been exposed to passive smoke. The patient's alcohol use is:  Social History   Substance and Sexual Activity  Alcohol Use Yes   Comment: socially   Additional information: Last pap 08/08/2020, next one scheduled for 3 years    Review of Systems  Constitutional: Negative.  Negative for chills, fatigue and fever.  HENT: Negative.  Negative for congestion, sinus pressure and sinus pain.   Eyes: Negative.   Respiratory: Negative.  Negative for cough, chest tightness, shortness of breath and wheezing.   Cardiovascular: Negative.  Negative for chest pain and palpitations.  Gastrointestinal: Negative.   Endocrine: Negative.  Negative for polydipsia, polyphagia and  polyuria.  Genitourinary: Negative.   Musculoskeletal: Negative.   Skin: Negative.   Allergic/Immunologic: Negative.   Neurological: Negative.  Negative for dizziness, weakness and numbness.  Hematological: Negative.   Psychiatric/Behavioral: Negative.      Today's Vitals   08/08/20 1452  Temp: 98.5 F (36.9 C)  TempSrc: Oral  Weight: 175 lb 6.4 oz (79.6 kg)  Height: $Remove'5\' 7"'yPBUqSx$  (1.702 m)   Body mass index is 27.47 kg/m.   Objective:  Physical Exam Vitals and nursing note reviewed.  Constitutional:      Appearance: Normal appearance.  HENT:     Head: Normocephalic and atraumatic.     Right Ear: Tympanic membrane, ear canal and external ear normal. There is impacted cerumen.     Left Ear: Tympanic membrane, ear canal and external ear normal.     Nose: Nose normal. No congestion.     Mouth/Throat:     Mouth: Mucous membranes are moist.     Pharynx: Oropharynx is clear.  Eyes:     Extraocular Movements: Extraocular movements intact.     Conjunctiva/sclera: Conjunctivae normal.     Pupils: Pupils are equal, round, and reactive to light.  Neck:     Thyroid: Thyroid tenderness present.  Cardiovascular:     Rate and Rhythm: Normal rate and regular rhythm.     Pulses: Normal pulses.     Heart sounds: Normal heart sounds. No murmur heard.   Pulmonary:     Effort: Pulmonary effort is normal.     Breath sounds: Normal breath sounds. No wheezing.  Abdominal:     General: Abdomen is flat. Bowel sounds are normal.     Palpations: Abdomen is soft.  Genitourinary:    Comments: deferred Musculoskeletal:        General: Normal range of motion.     Cervical back: Normal range of motion and neck supple.  Skin:    General: Skin is warm and dry.  Neurological:     General: No focal deficit present.     Mental Status: She is alert and oriented to person, place, and time.  Psychiatric:        Mood and Affect: Mood normal.        Behavior: Behavior normal.         Assessment And  Plan:     1. Establishing care with new doctor, encounter for -Patient is here to establish care. Martin Majestic over patient medical, family, social and surgical history. -Reviewed with patient their medications and any allergies  -Reviewed with patient their sexual orientation, drug/tobacco and alcohol use -Dicussed any new concerns with patient  -recommended patient comes in for a physical exam and complete blood work.  -Educated patient about the importance of annual screenings and immunizations.  -Advised patient to eat  a healthy diet along with exercise for atleast 30-45 min atleast 4-5 days of the week.   2. Annual physical exam --Patient is here for their annual physical exam and we discussed any changes to medication and medical history.  -Behavior modification was discussed as well as diet and exercise history  -Patient will continue to exercise regularly and modify their diet.  -Recommendation for yearly physical annuals, immunization and screenings including mammogram and colonoscopy were discussed with the patient.  -Recommended intake of multivitamin, vitamin D and calcium.  -Individualized advise was given to the patient pertaining to their own health history in regards to diet, exercise, medical condition and referrals.  - CBC - Hemoglobin A1c - CMP14+EGFR - Lipid panel - Cytology -Pap Smear  3. Screening mammogram, encounter for - MM Digital Diagnostic Bilat; Future  4. Encounter for screening for metabolic disorder -Will check thyroid panel  - TSH - T3, free - T4, Free  5. Vitamin D deficiency -Will check and supplement if needed. Advised patient to spend atleast 15 min. Daily in sunlight.  - Vitamin D (25 hydroxy)  6. Thyroid nodule -Will send her [place], she was told in the past she has a nodule but never had a [place].  - US THYROID; Future  7. Irregular periods/menstrual cycles -Send her a OBGYN referral  - Ambulatory referral to Obstetrics / Gynecology  8. Need for  Tdap vaccination - Tdap vaccine greater than or equal to 7yo IM  9. Impacted cerumen of right ear -Currete used in the right ear.   10. Overweight with body mass index (BMI) of 27 to 27.9 in adult -Advised patient on a healthy diet including avoiding fast food and red meats. Increase the intake of lean meats including grilled chicken and [place].  Drink a lot of water. Decrease intake of fatty foods. Exercise for 30-45 min. 4-5 a week to decrease the risk of cardiac event.   The patient was encouraged to call or send a message through [facility] for any questions or concerns.   Staying healthy and adopting a healthy lifestyle for your overall health is important. You should eat 7 or more servings of fruits and vegetables per day. You should drink plenty of water to keep yourself hydrated and your kidneys healthy. This includes about 65-80+ fluid ounces of water. Limit your intake of animal fats especially for elevated cholesterol. Avoid highly processed food and limit your salt intake if you have hypertension. Avoid foods high in saturated/Trans fats. Along with a healthy diet it is also very important to maintain time for yourself to maintain a healthy mental health with low stress levels. You should get atleast 150 min of moderate intensity exercise weekly for a healthy heart. Along with eating right and exercising, aim for at least 7-9 hours of sleep daily.  Eat more whole grains which includes barley, wheat berries, oats, brown rice and whole wheat pasta. Use healthy plant oils which include olive, soy, corn, sunflower and peanut. Limit your caffeine and sugary drinks. Limit your intake of fast foods. Limit milk and dairy products to one or two daily servings.   Follow up: one year for annual   Patient was given opportunity to ask questions. Patient verbalized understanding of the plan and was able to repeat key elements of the plan. All questions were answered to their satisfaction.  Raman Coury Grieger,  DNP   I, Raman Charlayne Vultaggio have reviewed all documentation for this visit. The documentation on 08/08/20 for the exam, diagnosis, procedures, and  orders are all accurate and complete.    THE PATIENT IS ENCOURAGED TO PRACTICE SOCIAL DISTANCING DUE TO THE COVID-19 PANDEMIC.

## 2020-08-08 NOTE — Patient Instructions (Signed)
Health Maintenance, Female Adopting a healthy lifestyle and getting preventive care are important in promoting health and wellness. Ask your health care provider about:  The right schedule for you to have regular tests and exams.  Things you can do on your own to prevent diseases and keep yourself healthy. What should I know about diet, weight, and exercise? Eat a healthy diet  Eat a diet that includes plenty of vegetables, fruits, low-fat dairy products, and lean protein.  Do not eat a lot of foods that are high in solid fats, added sugars, or sodium.   Maintain a healthy weight Body mass index (BMI) is used to identify weight problems. It estimates body fat based on height and weight. Your health care provider can help determine your BMI and help you achieve or maintain a healthy weight. Get regular exercise Get regular exercise. This is one of the most important things you can do for your health. Most adults should:  Exercise for at least 150 minutes each week. The exercise should increase your heart rate and make you sweat (moderate-intensity exercise).  Do strengthening exercises at least twice a week. This is in addition to the moderate-intensity exercise.  Spend less time sitting. Even light physical activity can be beneficial. Watch cholesterol and blood lipids Have your blood tested for lipids and cholesterol at 41 years of age, then have this test every 5 years. Have your cholesterol levels checked more often if:  Your lipid or cholesterol levels are high.  You are older than 40 years of age.  You are at high risk for heart disease. What should I know about cancer screening? Depending on your health history and family history, you may need to have cancer screening at various ages. This may include screening for:  Breast cancer.  Cervical cancer.  Colorectal cancer.  Skin cancer.  Lung cancer. What should I know about heart disease, diabetes, and high blood  pressure? Blood pressure and heart disease  High blood pressure causes heart disease and increases the risk of stroke. This is more likely to develop in people who have high blood pressure readings, are of African descent, or are overweight.  Have your blood pressure checked: ? Every 3-5 years if you are 18-39 years of age. ? Every year if you are 40 years old or older. Diabetes Have regular diabetes screenings. This checks your fasting blood sugar level. Have the screening done:  Once every three years after age 40 if you are at a normal weight and have a low risk for diabetes.  More often and at a younger age if you are overweight or have a high risk for diabetes. What should I know about preventing infection? Hepatitis B If you have a higher risk for hepatitis B, you should be screened for this virus. Talk with your health care provider to find out if you are at risk for hepatitis B infection. Hepatitis C Testing is recommended for:  Everyone born from 1945 through 1965.  Anyone with known risk factors for hepatitis C. Sexually transmitted infections (STIs)  Get screened for STIs, including gonorrhea and chlamydia, if: ? You are sexually active and are younger than 41 years of age. ? You are older than 41 years of age and your health care provider tells you that you are at risk for this type of infection. ? Your sexual activity has changed since you were last screened, and you are at increased risk for chlamydia or gonorrhea. Ask your health care provider   if you are at risk.  Ask your health care provider about whether you are at high risk for HIV. Your health care provider may recommend a prescription medicine to help prevent HIV infection. If you choose to take medicine to prevent HIV, you should first get tested for HIV. You should then be tested every 3 months for as long as you are taking the medicine. Pregnancy  If you are about to stop having your period (premenopausal) and  you may become pregnant, seek counseling before you get pregnant.  Take 400 to 800 micrograms (mcg) of folic acid every day if you become pregnant.  Ask for birth control (contraception) if you want to prevent pregnancy. Osteoporosis and menopause Osteoporosis is a disease in which the bones lose minerals and strength with aging. This can result in bone fractures. If you are 65 years old or older, or if you are at risk for osteoporosis and fractures, ask your health care provider if you should:  Be screened for bone loss.  Take a calcium or vitamin D supplement to lower your risk of fractures.  Be given hormone replacement therapy (HRT) to treat symptoms of menopause. Follow these instructions at home: Lifestyle  Do not use any products that contain nicotine or tobacco, such as cigarettes, e-cigarettes, and chewing tobacco. If you need help quitting, ask your health care provider.  Do not use street drugs.  Do not share needles.  Ask your health care provider for help if you need support or information about quitting drugs. Alcohol use  Do not drink alcohol if: ? Your health care provider tells you not to drink. ? You are pregnant, may be pregnant, or are planning to become pregnant.  If you drink alcohol: ? Limit how much you use to 0-1 drink a day. ? Limit intake if you are breastfeeding.  Be aware of how much alcohol is in your drink. In the [place] one drink equals one 12 oz bottle of beer (355 mL), one 5 oz glass of wine (148 mL), or one 1 oz glass of hard liquor (44 mL). General instructions  Schedule regular health, dental, and eye exams.  Stay current with your vaccines.  Tell your health care provider if: ? You often feel depressed. ? You have ever been abused or do not feel safe at home. Summary  Adopting a healthy lifestyle and getting preventive care are important in promoting health and wellness.  Follow your health care provider's instructions about healthy  diet, exercising, and getting tested or screened for diseases.  Follow your health care provider's instructions on monitoring your cholesterol and blood pressure. This information is not intended to replace advice given to you by your health care provider. Make sure you discuss any questions you have with your health care provider. Document Revised: 02/11/2018 Document Reviewed: 02/11/2018 Elsevier Patient Education  2021 [employer].  

## 2020-08-09 LAB — CMP14+EGFR
ALT: 7 IU/L (ref 0–32)
AST: 10 IU/L (ref 0–40)
Albumin/Globulin Ratio: 1.6 (ref 1.2–2.2)
Albumin: 4.1 g/dL (ref 3.8–4.8)
Alkaline Phosphatase: 51 IU/L (ref 44–121)
BUN/Creatinine Ratio: 24 — ABNORMAL HIGH (ref 9–23)
BUN: 19 mg/dL (ref 6–24)
Bilirubin Total: <0.2 mg/dL (ref 0.0–1.2)
CO2: 22 mmol/L (ref 20–29)
Calcium: 9 mg/dL (ref 8.7–10.2)
Chloride: 106 mmol/L (ref 96–106)
Creatinine, Ser: 0.79 mg/dL (ref 0.57–1.00)
Globulin, Total: 2.5 g/dL (ref 1.5–4.5)
Glucose: 84 mg/dL (ref 65–99)
Potassium: 4.7 mmol/L (ref 3.5–5.2)
Sodium: 144 mmol/L (ref 134–144)
Total Protein: 6.6 g/dL (ref 6.0–8.5)
eGFR: 97 mL/min/{1.73_m2} (ref >59)

## 2020-08-09 LAB — HEMOGLOBIN A1C
Est. average glucose Bld gHb Est-mCnc: 111 mg/dL
Hgb A1c MFr Bld: 5.5 % (ref 4.8–5.6)

## 2020-08-09 LAB — CBC
Hematocrit: 36.8 % (ref 34.0–46.6)
Hemoglobin: 12.1 g/dL (ref 11.1–15.9)
MCH: 26.2 pg — ABNORMAL LOW (ref 26.6–33.0)
MCHC: 32.9 g/dL (ref 31.5–35.7)
MCV: 80 fL (ref 79–97)
Platelets: 320 10*3/uL (ref 150–450)
RBC: 4.61 x10E6/uL (ref 3.77–5.28)
RDW: 12.7 % (ref 11.7–15.4)
WBC: 8 10*3/uL (ref 3.4–10.8)

## 2020-08-09 LAB — T4, FREE: Free T4: 1.16 ng/dL (ref 0.82–1.77)

## 2020-08-09 LAB — LIPID PANEL
Chol/HDL Ratio: 3.1 ratio (ref 0.0–4.4)
Cholesterol, Total: 178 mg/dL (ref 100–199)
HDL: 57 mg/dL (ref >39)
LDL Chol Calc (NIH): 100 mg/dL — ABNORMAL HIGH (ref 0–99)
Triglycerides: 116 mg/dL (ref 0–149)
VLDL Cholesterol Cal: 21 mg/dL (ref 5–40)

## 2020-08-09 LAB — T3, FREE: T3, Free: 2 pg/mL (ref 2.0–4.4)

## 2020-08-09 LAB — TSH: TSH: 1.22 u[IU]/mL (ref 0.450–4.500)

## 2020-08-09 LAB — VITAMIN D 25 HYDROXY (VIT D DEFICIENCY, FRACTURES): Vit D, 25-Hydroxy: 51.2 ng/mL (ref 30.0–100.0)

## 2020-08-15 LAB — CYTOLOGY - PAP
Chlamydia: NEGATIVE
Comment: NEGATIVE
Comment: NEGATIVE
Comment: NEGATIVE
Comment: NORMAL
Diagnosis: UNDETERMINED — AB
High risk HPV: NEGATIVE
Neisseria Gonorrhea: NEGATIVE
Trichomonas: NEGATIVE

## 2020-09-19 ENCOUNTER — Other Ambulatory Visit: Payer: Self-pay | Admitting: Nurse Practitioner

## 2020-10-20 ENCOUNTER — Ambulatory Visit: Payer: Self-pay

## 2020-10-30 ENCOUNTER — Other Ambulatory Visit: Payer: Self-pay | Admitting: Obstetrics and Gynecology

## 2020-11-17 ENCOUNTER — Other Ambulatory Visit: Payer: Self-pay | Admitting: Obstetrics and Gynecology

## 2021-01-30 NOTE — Pre-Procedure Instructions (Signed)
Surgical Instructions    Your procedure is scheduled on Wednesday 02/07/21.   Report to [facility] Main Entrance "A" at 06:30 A.M., then check in with the Admitting office.  Call this number if you have problems the morning of surgery:  [phone]   If you have any questions prior to your surgery date call [phone]: Open Monday-Friday 8am-4pm    Remember:  Do not eat or drink after midnight the night before your surgery     Take these medicines the morning of surgery with A SIP OF WATER   famotidine (PEPCID)    As of today, STOP taking any Aspirin (unless otherwise instructed by your surgeon) Aleve, Naproxen, Ibuprofen, Motrin, Advil, Goody's, BC's, all herbal medications, fish oil, and all vitamins.                     Do NOT Smoke (Tobacco/Vaping) or drink Alcohol 24 hours prior to your procedure.  If you use a CPAP at night, you may bring all equipment for your overnight stay.   Contacts, glasses, piercing's, hearing aid's, dentures or partials may not be worn into surgery, please bring cases for these belongings.    For patients admitted to the hospital, discharge time will be determined by your treatment team.   Patients discharged the day of surgery will not be allowed to drive home, and someone needs to stay with them for 24 hours.  NO VISITORS WILL BE ALLOWED IN PRE-OP WHERE PATIENTS GET READY FOR SURGERY.  ONLY 1 SUPPORT PERSON MAY BE PRESENT IN THE WAITING ROOM WHILE YOU ARE IN SURGERY.  IF YOU ARE TO BE ADMITTED, ONCE YOU ARE IN YOUR ROOM YOU WILL BE ALLOWED TWO (2) VISITORS.  Minor children may have two parents present. Special consideration for safety and communication needs will be reviewed on a case by case basis.   Special instructions:   [place] [place]- Preparing For Surgery  Before surgery, you can play an important role. Because skin is not sterile, your skin needs to be as free of germs as possible. You can reduce the number of germs on your skin by  washing with CHG (chlorahexidine gluconate) Soap before surgery.  CHG is an antiseptic cleaner which kills germs and bonds with the skin to continue killing germs even after washing.    Oral Hygiene is also important to reduce your risk of infection.  Remember - BRUSH YOUR TEETH THE MORNING OF SURGERY WITH YOUR REGULAR TOOTHPASTE  Please do not use if you have an allergy to CHG or antibacterial soaps. If your skin becomes reddened/irritated stop using the CHG.  Do not shave (including legs and underarms) for at least 48 hours prior to first CHG shower. It is OK to shave your face.  Please follow these instructions carefully.   Shower the NIGHT BEFORE SURGERY and the MORNING OF SURGERY  If you chose to wash your hair, wash your hair first as usual with your normal shampoo.  After you shampoo, rinse your hair and body thoroughly to remove the shampoo.  Use CHG Soap as you would any other liquid soap. You can apply CHG directly to the skin and wash gently with a scrungie or a clean washcloth.   Apply the CHG Soap to your body ONLY FROM THE NECK DOWN.  Do not use on open wounds or open sores. Avoid contact with your eyes, ears, mouth and genitals (private parts). Wash Face and genitals (private parts)  with your normal soap.  Wash thoroughly, paying special attention to the area where your surgery will be performed.  Thoroughly rinse your body with warm water from the neck down.  DO NOT shower/wash with your normal soap after using and rinsing off the CHG Soap.  Pat yourself dry with a CLEAN TOWEL.  Wear CLEAN PAJAMAS to bed the night before surgery  Place CLEAN SHEETS on your bed the night before your surgery  DO NOT SLEEP WITH PETS.   Day of Surgery: Shower with CHG soap. Do not wear jewelry, make up, nail polish, gel polish, artificial nails, or any other type of covering on natural nails including finger and toenails. If patients have artificial nails, gel coating, etc. that  need to be removed by a nail salon please have this removed prior to surgery. Surgery may need to be canceled/delayed if the surgeon/ anesthesia feels like the patient is unable to be adequately monitored. Do not wear lotions, powders, perfumes/colognes, or deodorant. Do not shave 48 hours prior to surgery.  Men may shave face and neck. Do not bring valuables to the hospital. [facility] is not responsible for any belongings or valuables. Wear Clean/Comfortable clothing the morning of surgery Remember to brush your teeth WITH YOUR REGULAR TOOTHPASTE.   Please read over the following fact sheets that you were given.   3 days prior to your procedure or After your COVID test   You are not required to quarantine however you are required to wear a well-fitting mask when you are out and around people not in your household. If your mask becomes wet or soiled, replace with a new one.   Wash your hands often with soap and water for 20 seconds or clean your hands with an alcohol-based hand sanitizer that contains at least 60% alcohol.   Do not share personal items.   Notify your provider:  o if you are in close contact with someone who has COVID  o or if you develop a fever of 100.4 or greater, sneezing, cough, sore throat, shortness of breath or body aches.

## 2021-01-31 ENCOUNTER — Encounter (HOSPITAL_COMMUNITY)
Admission: RE | Admit: 2021-01-31 | Discharge: 2021-01-31 | Disposition: A | Discharge status: Home / Self Care | Payer: No Typology Code available for payment source | Source: Ambulatory Visit | Attending: Obstetrics and Gynecology | Admitting: Obstetrics and Gynecology

## 2021-01-31 ENCOUNTER — Inpatient Hospital Stay (HOSPITAL_COMMUNITY)
Admission: RE | Admit: 2021-01-31 | Discharge: 2021-01-31 | Disposition: A | Discharge status: Home / Self Care | Payer: No Typology Code available for payment source | Source: Ambulatory Visit

## 2021-01-31 ENCOUNTER — Other Ambulatory Visit: Payer: Self-pay

## 2021-01-31 ENCOUNTER — Encounter (HOSPITAL_COMMUNITY): Payer: Self-pay

## 2021-01-31 DIAGNOSIS — Z01812 Encounter for preprocedural laboratory examination: Secondary | ICD-10-CM | POA: Insufficient documentation

## 2021-01-31 DIAGNOSIS — N938 Other specified abnormal uterine and vaginal bleeding: Secondary | ICD-10-CM | POA: Diagnosis present

## 2021-01-31 HISTORY — DX: Family history of other specified conditions: Z84.89

## 2021-01-31 HISTORY — DX: Cardiac murmur, unspecified: R01.1

## 2021-01-31 HISTORY — DX: Headache, unspecified: R51.9

## 2021-01-31 HISTORY — DX: Anemia, unspecified: D64.9

## 2021-01-31 HISTORY — DX: Gastro-esophageal reflux disease without esophagitis: K21.9

## 2021-01-31 LAB — COMPREHENSIVE METABOLIC PANEL
ALT: 10 U/L (ref 0–44)
AST: 13 U/L — ABNORMAL LOW (ref 15–41)
Albumin: 3.7 g/dL (ref 3.5–5.0)
Alkaline Phosphatase: 49 U/L (ref 38–126)
Anion gap: 5 (ref 5–15)
BUN: 15 mg/dL (ref 6–20)
CO2: 28 mmol/L (ref 22–32)
Calcium: 8.7 mg/dL — ABNORMAL LOW (ref 8.9–10.3)
Chloride: 105 mmol/L (ref 98–111)
Creatinine, Ser: 0.78 mg/dL (ref 0.44–1.00)
GFR, Estimated: >60 mL/min (ref >60)
Glucose, Bld: 98 mg/dL (ref 70–99)
Potassium: 4.2 mmol/L (ref 3.5–5.1)
Sodium: 138 mmol/L (ref 135–145)
Total Bilirubin: 0.4 mg/dL (ref 0.3–1.2)
Total Protein: 6.5 g/dL (ref 6.5–8.1)

## 2021-01-31 LAB — TYPE AND SCREEN
ABO/RH(D): AB POS
Antibody Screen: NEGATIVE

## 2021-01-31 LAB — CBC
HCT: 37.9 % (ref 36.0–46.0)
Hemoglobin: 12.1 g/dL (ref 12.0–15.0)
MCH: 26.4 pg (ref 26.0–34.0)
MCHC: 31.9 g/dL (ref 30.0–36.0)
MCV: 82.8 fL (ref 80.0–100.0)
Platelets: 310 10*3/uL (ref 150–400)
RBC: 4.58 MIL/uL (ref 3.87–5.11)
RDW: 13 % (ref 11.5–15.5)
WBC: 7 10*3/uL (ref 4.0–10.5)
nRBC: 0 % (ref 0.0–0.2)

## 2021-01-31 NOTE — H&P (Signed)
Kylie Rivas is a 40 y.o. female, P: 3-0-0-3, is presenting for hysterectomy because of dysfunctional uterine bleeding. For the past two years the patient reports a menstrual period every 19 days that will last for 6 days and require a maxi pad change every hour. She goes on to report severe cramping,  but is able to find relief with Ibuprofen and Midol.  She denies changes in bowel or bladder function, vaginitis symptoms or dyspareunia.  A pelvic ultrasound in August 2022 revealed an anteverted uterus: (9.7 cm from fundus to external os): 7.30 x 5.06 x 7.51 cm, endometrium: 11.20 mm; right ovary-3.52 cm and left ovary-2.54 cm.  An endometrial biopsy at that same time returned secretory endometrium with no hyperplasia or malignancy.  The patient opted not to pursue medical management of her symptoms,  since she has undergone surgical sterilization.  Therefore she wants to proceed with definitive therapy in the form of hysterectomy.  Past Medical History  OB History: G:3; P: 3-0-0-3;  SVB: 2002, 2005 and 2008 (largest infant 8 lbs. 3 oz.)  GYN History: menarche: 40 YO;  LMP :01/21/2021;   Contraception  Essure ;  Last PAP smear: 2022 normal   Medical History: Peptic Ulcer Disease, Asthma and Migraine  Surgical History: 2003 Right Ankle Ligament Repair;  2010 Essure Sterilization; 2011: Umbilical Hernia Repair (no mesh); 2012 Left Ankle Ligament Repair Denies problems with anesthesia or history of blood transfusions  Family History: Breast Cancer, Cardiovascular Disease, Thyroid Disease, Hypertension, Hypercholesterolemia, Diabetes Mellitus, Stroke and Lung Cancer  Social History:   Separated and employed as a Scientist, forensic;  Admits to smoking 2 cigarettes a week and occasional alcohol  Medications: Famotidine 20 mg po bid Naproxen 550 mg po pc every 12 hours prn  Allergies  Allergen Reactions   Aspirin Anaphylaxis   Iodinated Diagnostic Agents Anaphylaxis   Shellfish  Allergy Shortness Of Breath and Rash    Denies sensitivity to peanuts, soy, latex or adhesives.   ROS: Admits to glasses, occasional bilateral ankle swelling (related to previous surgeries)  and chronic nasal congestion  but denies headache, vision changes, dysphagia, tinnitus, dizziness, hoarseness, cough,  chest pain, shortness of breath, nausea, vomiting, diarrhea,constipation,  urinary frequency, urgency  dysuria, hematuria, vaginitis symptoms, pelvic pain, swelling of joints,easy bruising,  myalgias, arthralgias, skin rashes, unexplained weight loss and except as is mentioned in the history of present illness, patient's review of systems is otherwise negative.    Physical Exam  Bp: 112/68;  Weight: 184 lbs.;  Height: 5'4";  BMI: 31.6; Temperature: 98.2 degrees F orally  Neck: supple without masses or thyromegaly Lungs: clear to auscultation Heart: regular rate and rhythm Abdomen: soft, non-tender and no organomegaly Pelvic:EGBUS- wnl; vagina-normal rugae; uterus-normal size, cervix without lesions or motion tenderness; adnexae-no tenderness or masses Extremities:  no clubbing, cyanosis or edema   Assesment: Dysfunctional Uterine Bleeding   Disposition:  A discussion was held with patient regarding the indication for her procedure(s) along with the risks, which include but are not limited to: reaction to anesthesia, damage to adjacent organs, infection, excessive bleeding and possible need for an open abdominal incision.  The patient verbalized understanding of these risks and has consented to proceed with a Laparoscopically Assisted Vaginal Hysterectomy, Bilateral Salpingectomy and Possible Abdominal Hysterectomy at [facility] on February 07, 2021 at 9 a.m.   CSN# [id number]   [name] J. Lowell Guitar, PA-C  for Dr. Pierre Bali. Dillard

## 2021-01-31 NOTE — Progress Notes (Signed)
PCP - Dr. Charlesetta Ivory Cardiologist - denies  PPM/ICD - n/a Device Orders - n/a Rep Notified - n/a  Chest x-ray - n/a EKG - n/a Stress Test - denies ECHO - denies Cardiac Cath - denies  Sleep Study - denies CPAP - n/a  Fasting Blood Sugar - n/a Checks Blood Sugar _____ times a day- n/a  Blood Thinner Instructions: n/a Aspirin Instructions: n/a  ERAS Protcol - No. NPO PRE-SURGERY Ensure or G2- n/a  COVID TEST- Scheduled for 02/05/21 at 12:15. Patient is aware of date, time, and location.    Anesthesia review: No  Patient denies shortness of breath, fever, cough and chest pain at PAT appointment   All instructions explained to the patient, with a verbal understanding of the material. Patient agrees to go over the instructions while at home for a better understanding. Patient also instructed to self quarantine after being tested for COVID-19. The opportunity to ask questions was provided.

## 2021-02-05 ENCOUNTER — Other Ambulatory Visit (HOSPITAL_COMMUNITY)
Admission: RE | Admit: 2021-02-05 | Discharge: 2021-02-05 | Disposition: A | Discharge status: Home / Self Care | Payer: No Typology Code available for payment source | Source: Ambulatory Visit | Attending: Obstetrics and Gynecology | Admitting: Obstetrics and Gynecology

## 2021-02-05 DIAGNOSIS — Z20822 Contact with and (suspected) exposure to covid-19: Secondary | ICD-10-CM | POA: Diagnosis not present

## 2021-02-05 DIAGNOSIS — Z01812 Encounter for preprocedural laboratory examination: Secondary | ICD-10-CM | POA: Insufficient documentation

## 2021-02-05 DIAGNOSIS — Z01818 Encounter for other preprocedural examination: Secondary | ICD-10-CM

## 2021-02-06 LAB — SARS CORONAVIRUS 2 (TAT 6-24 HRS): SARS Coronavirus 2: NEGATIVE

## 2021-02-07 ENCOUNTER — Encounter (HOSPITAL_COMMUNITY): Payer: Self-pay | Admitting: Obstetrics and Gynecology

## 2021-02-07 ENCOUNTER — Ambulatory Visit (HOSPITAL_COMMUNITY): Payer: No Typology Code available for payment source | Admitting: Certified Registered Nurse Anesthetist

## 2021-02-07 ENCOUNTER — Other Ambulatory Visit: Payer: Self-pay

## 2021-02-07 ENCOUNTER — Encounter (HOSPITAL_COMMUNITY)
Admission: RE | Disposition: A | Discharge status: Home / Self Care | Payer: Self-pay | Source: Ambulatory Visit | Attending: Obstetrics and Gynecology

## 2021-02-07 ENCOUNTER — Observation Stay (HOSPITAL_COMMUNITY)
Admission: RE | Admit: 2021-02-07 | Discharge: 2021-02-08 | Disposition: A | Discharge status: Home / Self Care | Payer: No Typology Code available for payment source | Source: Ambulatory Visit | Attending: Obstetrics and Gynecology | Admitting: Obstetrics and Gynecology

## 2021-02-07 DIAGNOSIS — F17210 Nicotine dependence, cigarettes, uncomplicated: Secondary | ICD-10-CM | POA: Diagnosis not present

## 2021-02-07 DIAGNOSIS — N870 Mild cervical dysplasia: Principal | ICD-10-CM | POA: Insufficient documentation

## 2021-02-07 DIAGNOSIS — J45909 Unspecified asthma, uncomplicated: Secondary | ICD-10-CM | POA: Diagnosis not present

## 2021-02-07 DIAGNOSIS — N920 Excessive and frequent menstruation with regular cycle: Secondary | ICD-10-CM | POA: Diagnosis present

## 2021-02-07 DIAGNOSIS — N938 Other specified abnormal uterine and vaginal bleeding: Secondary | ICD-10-CM | POA: Diagnosis present

## 2021-02-07 DIAGNOSIS — N841 Polyp of cervix uteri: Secondary | ICD-10-CM | POA: Insufficient documentation

## 2021-02-07 DIAGNOSIS — Z90710 Acquired absence of both cervix and uterus: Secondary | ICD-10-CM | POA: Diagnosis present

## 2021-02-07 HISTORY — PX: CYSTOSCOPY: SHX5120

## 2021-02-07 HISTORY — PX: LAPAROSCOPIC VAGINAL HYSTERECTOMY WITH SALPINGECTOMY: SHX6680

## 2021-02-07 LAB — POCT PREGNANCY, URINE: Preg Test, Ur: NEGATIVE

## 2021-02-07 LAB — ABO/RH: ABO/RH(D): AB POS

## 2021-02-07 SURGERY — HYSTERECTOMY, VAGINAL, LAPAROSCOPY-ASSISTED, WITH SALPINGECTOMY
Anesthesia: General | Site: Bladder | Laterality: Bilateral

## 2021-02-07 MED ORDER — FENTANYL CITRATE (PF) 100 MCG/2ML IJ SOLN
INTRAMUSCULAR | Status: AC
  Filled 2021-02-07: qty 2

## 2021-02-07 MED ORDER — ACETAMINOPHEN 10 MG/ML IV SOLN: 1000.0000 mg | Freq: Once | INTRAVENOUS | Status: DC | PRN

## 2021-02-07 MED ORDER — DEXAMETHASONE SODIUM PHOSPHATE 10 MG/ML IJ SOLN
INTRAMUSCULAR | Status: AC
  Filled 2021-02-07: qty 1

## 2021-02-07 MED ORDER — BUPIVACAINE HCL (PF) 0.25 % IJ SOLN
INTRAMUSCULAR | Status: AC
  Filled 2021-02-07: qty 30

## 2021-02-07 MED ORDER — ROCURONIUM BROMIDE 10 MG/ML (PF) SYRINGE
PREFILLED_SYRINGE | INTRAVENOUS | Status: DC | PRN
  Administered 2021-02-07: 10 mg via INTRAVENOUS
  Administered 2021-02-07: 80 mg via INTRAVENOUS
  Administered 2021-02-07: 10 mg via INTRAVENOUS
  Administered 2021-02-07: 20 mg via INTRAVENOUS

## 2021-02-07 MED ORDER — FENTANYL CITRATE (PF) 250 MCG/5ML IJ SOLN
INTRAMUSCULAR | Status: AC
  Filled 2021-02-07: qty 5

## 2021-02-07 MED ORDER — VASOPRESSIN 20 UNIT/ML IV SOLN
INTRAVENOUS | Status: DC | PRN
  Administered 2021-02-07: 24 mL via INTRAMUSCULAR

## 2021-02-07 MED ORDER — ONDANSETRON HCL 4 MG/2ML IJ SOLN
INTRAMUSCULAR | Status: AC
  Filled 2021-02-07: qty 2

## 2021-02-07 MED ORDER — LORATADINE 10 MG PO TABS
10.0000 mg | ORAL_TABLET | Freq: Every day | ORAL | Status: DC
  Administered 2021-02-07 – 2021-02-08 (×2): 10 mg via ORAL
  Filled 2021-02-07 (×2): qty 1

## 2021-02-07 MED ORDER — NALOXONE HCL 0.4 MG/ML IJ SOLN: 0.4000 mg | INTRAMUSCULAR | Status: DC | PRN

## 2021-02-07 MED ORDER — ONDANSETRON HCL 4 MG/2ML IJ SOLN
INTRAMUSCULAR | Status: DC | PRN
  Administered 2021-02-07: 4 mg via INTRAVENOUS

## 2021-02-07 MED ORDER — DEXAMETHASONE SODIUM PHOSPHATE 10 MG/ML IJ SOLN
INTRAMUSCULAR | Status: DC | PRN
  Administered 2021-02-07: 10 mg via INTRAVENOUS

## 2021-02-07 MED ORDER — MENTHOL 3 MG MT LOZG: 1.0000 | LOZENGE | OROMUCOSAL | Status: DC | PRN

## 2021-02-07 MED ORDER — ROCURONIUM BROMIDE 10 MG/ML (PF) SYRINGE
PREFILLED_SYRINGE | INTRAVENOUS | Status: AC
  Filled 2021-02-07: qty 10

## 2021-02-07 MED ORDER — OXYCODONE HCL 5 MG PO TABS: 5.0000 mg | ORAL_TABLET | Freq: Once | ORAL | Status: DC | PRN

## 2021-02-07 MED ORDER — POVIDONE-IODINE 10 % EX SWAB
2.0000 "application " | Freq: Once | CUTANEOUS | Status: AC
  Administered 2021-02-07: 2 via TOPICAL

## 2021-02-07 MED ORDER — SODIUM CHLORIDE (PF) 0.9 % IJ SOLN
INTRAMUSCULAR | Status: AC
  Filled 2021-02-07: qty 100

## 2021-02-07 MED ORDER — FENTANYL CITRATE (PF) 250 MCG/5ML IJ SOLN
INTRAMUSCULAR | Status: DC | PRN
  Administered 2021-02-07: 50 ug via INTRAVENOUS
  Administered 2021-02-07: 100 ug via INTRAVENOUS
  Administered 2021-02-07: 25 ug via INTRAVENOUS
  Administered 2021-02-07: 50 ug via INTRAVENOUS
  Administered 2021-02-07: 25 ug via INTRAVENOUS

## 2021-02-07 MED ORDER — ACETAMINOPHEN 10 MG/ML IV SOLN
INTRAVENOUS | Status: AC
  Filled 2021-02-07: qty 100

## 2021-02-07 MED ORDER — VASOPRESSIN 20 UNIT/ML IV SOLN
INTRAVENOUS | Status: AC
  Filled 2021-02-07: qty 1

## 2021-02-07 MED ORDER — LACTATED RINGERS IV SOLN: INTRAVENOUS | Status: DC

## 2021-02-07 MED ORDER — CEFAZOLIN SODIUM-DEXTROSE 2-3 GM-%(50ML) IV SOLR
INTRAVENOUS | Status: DC | PRN
  Administered 2021-02-07: 2 g via INTRAVENOUS

## 2021-02-07 MED ORDER — STERILE WATER FOR IRRIGATION IR SOLN
Status: DC | PRN
  Administered 2021-02-07: 1000 mL via INTRAVESICAL

## 2021-02-07 MED ORDER — FLUORESCEIN SODIUM 10 % IV SOLN
INTRAVENOUS | Status: AC
  Filled 2021-02-07: qty 5

## 2021-02-07 MED ORDER — ACETAMINOPHEN 500 MG PO TABS: 1000.0000 mg | ORAL_TABLET | Freq: Once | ORAL | Status: DC | PRN

## 2021-02-07 MED ORDER — ONDANSETRON HCL 4 MG/2ML IJ SOLN: 4.0000 mg | Freq: Four times a day (QID) | INTRAMUSCULAR | Status: DC | PRN

## 2021-02-07 MED ORDER — PROPOFOL 500 MG/50ML IV EMUL
INTRAVENOUS | Status: DC | PRN
  Administered 2021-02-07: 25 ug/kg/min via INTRAVENOUS

## 2021-02-07 MED ORDER — ORAL CARE MOUTH RINSE: 15.0000 mL | Freq: Once | OROMUCOSAL | Status: DC

## 2021-02-07 MED ORDER — SUGAMMADEX SODIUM 200 MG/2ML IV SOLN
INTRAVENOUS | Status: DC | PRN
  Administered 2021-02-07: 165.6 mg via INTRAVENOUS

## 2021-02-07 MED ORDER — LIDOCAINE 2% (20 MG/ML) 5 ML SYRINGE
INTRAMUSCULAR | Status: AC
  Filled 2021-02-07: qty 5

## 2021-02-07 MED ORDER — LIDOCAINE 2% (20 MG/ML) 5 ML SYRINGE
INTRAMUSCULAR | Status: DC | PRN
  Administered 2021-02-07: 60 mg via INTRAVENOUS

## 2021-02-07 MED ORDER — ONDANSETRON HCL 4 MG PO TABS: 4.0000 mg | ORAL_TABLET | Freq: Four times a day (QID) | ORAL | Status: DC | PRN

## 2021-02-07 MED ORDER — METOCLOPRAMIDE HCL 5 MG/ML IJ SOLN
10.0000 mg | Freq: Four times a day (QID) | INTRAMUSCULAR | Status: DC
  Administered 2021-02-07 – 2021-02-08 (×3): 10 mg via INTRAVENOUS
  Filled 2021-02-07 (×3): qty 2

## 2021-02-07 MED ORDER — ACETAMINOPHEN 10 MG/ML IV SOLN
INTRAVENOUS | Status: DC | PRN
  Administered 2021-02-07: 1000 mg via INTRAVENOUS

## 2021-02-07 MED ORDER — DIPHENHYDRAMINE HCL 12.5 MG/5ML PO ELIX: 12.5000 mg | ORAL_SOLUTION | Freq: Four times a day (QID) | ORAL | Status: DC | PRN

## 2021-02-07 MED ORDER — DIPHENHYDRAMINE HCL 50 MG/ML IJ SOLN: 12.5000 mg | Freq: Four times a day (QID) | INTRAMUSCULAR | Status: DC | PRN

## 2021-02-07 MED ORDER — BUPIVACAINE HCL (PF) 0.25 % IJ SOLN
INTRAMUSCULAR | Status: DC | PRN
  Administered 2021-02-07: 10 mL

## 2021-02-07 MED ORDER — OXYCODONE-ACETAMINOPHEN 5-325 MG PO TABS: 1.0000 | ORAL_TABLET | ORAL | Status: DC | PRN

## 2021-02-07 MED ORDER — MIDAZOLAM HCL 2 MG/2ML IJ SOLN
INTRAMUSCULAR | Status: AC
  Filled 2021-02-07: qty 2

## 2021-02-07 MED ORDER — KETOROLAC TROMETHAMINE 30 MG/ML IJ SOLN
INTRAMUSCULAR | Status: AC
  Filled 2021-02-07: qty 1

## 2021-02-07 MED ORDER — FAMOTIDINE 20 MG PO TABS
20.0000 mg | ORAL_TABLET | Freq: Two times a day (BID) | ORAL | Status: DC
  Administered 2021-02-07 – 2021-02-08 (×2): 20 mg via ORAL
  Filled 2021-02-07 (×2): qty 1

## 2021-02-07 MED ORDER — CEFAZOLIN SODIUM-DEXTROSE 2-4 GM/100ML-% IV SOLN
2.0000 g | INTRAVENOUS | Status: AC
  Administered 2021-02-07: 2 g via INTRAVENOUS
  Filled 2021-02-07: qty 100

## 2021-02-07 MED ORDER — 0.9 % SODIUM CHLORIDE (POUR BTL) OPTIME
TOPICAL | Status: DC | PRN
  Administered 2021-02-07: 1000 mL

## 2021-02-07 MED ORDER — ESTRADIOL 0.1 MG/GM VA CREA
TOPICAL_CREAM | VAGINAL | Status: AC
  Filled 2021-02-07: qty 42.5

## 2021-02-07 MED ORDER — PROPOFOL 10 MG/ML IV BOLUS
INTRAVENOUS | Status: DC | PRN
  Administered 2021-02-07: 140 mg via INTRAVENOUS

## 2021-02-07 MED ORDER — HEMOSTATIC AGENTS (NO CHARGE) OPTIME
TOPICAL | Status: DC | PRN
  Administered 2021-02-07: 1 via TOPICAL

## 2021-02-07 MED ORDER — FENTANYL CITRATE (PF) 100 MCG/2ML IJ SOLN
25.0000 ug | INTRAMUSCULAR | Status: DC | PRN
  Administered 2021-02-07 (×2): 50 ug via INTRAVENOUS

## 2021-02-07 MED ORDER — MIDAZOLAM HCL 5 MG/5ML IJ SOLN
INTRAMUSCULAR | Status: DC | PRN
  Administered 2021-02-07: 2 mg via INTRAVENOUS

## 2021-02-07 MED ORDER — ACETAMINOPHEN 160 MG/5ML PO SOLN: 1000.0000 mg | Freq: Once | ORAL | Status: DC | PRN

## 2021-02-07 MED ORDER — KETOROLAC TROMETHAMINE 30 MG/ML IJ SOLN
INTRAMUSCULAR | Status: DC | PRN
  Administered 2021-02-07: 30 mg via INTRAVENOUS

## 2021-02-07 MED ORDER — LIDOCAINE-EPINEPHRINE 1 %-1:100000 IJ SOLN
INTRAMUSCULAR | Status: AC
  Filled 2021-02-07: qty 1

## 2021-02-07 MED ORDER — CHLORHEXIDINE GLUCONATE 0.12 % MT SOLN
15.0000 mL | Freq: Once | OROMUCOSAL | Status: DC
  Filled 2021-02-07: qty 15

## 2021-02-07 MED ORDER — SIMETHICONE 80 MG PO CHEW: 80.0000 mg | CHEWABLE_TABLET | Freq: Four times a day (QID) | ORAL | Status: DC | PRN

## 2021-02-07 MED ORDER — SODIUM CHLORIDE 0.9% FLUSH: 9.0000 mL | INTRAVENOUS | Status: DC | PRN

## 2021-02-07 MED ORDER — OXYCODONE HCL 5 MG/5ML PO SOLN: 5.0000 mg | Freq: Once | ORAL | Status: DC | PRN

## 2021-02-07 MED ORDER — HYDROMORPHONE 1 MG/ML IV SOLN
INTRAVENOUS | Status: DC
  Filled 2021-02-07: qty 30

## 2021-02-07 SURGICAL SUPPLY — 62 items
ADH SKN CLS APL DERMABOND .7 (GAUZE/BANDAGES/DRESSINGS) ×2
APL SRG 38 LTWT LNG FL B (MISCELLANEOUS) ×2
APPLICATOR ARISTA FLEXITIP XL (MISCELLANEOUS) ×1 IMPLANT
CABLE HIGH FREQUENCY MONO STRZ (ELECTRODE) IMPLANT
COVER BACK TABLE 60X90IN (DRAPES) ×3 IMPLANT
COVER MAYO STAND STRL (DRAPES) ×3 IMPLANT
DECANTER SPIKE VIAL GLASS SM (MISCELLANEOUS) IMPLANT
DERMABOND ADVANCED (GAUZE/BANDAGES/DRESSINGS) ×1
DERMABOND ADVANCED .7 DNX12 (GAUZE/BANDAGES/DRESSINGS) IMPLANT
DRAPE POUCH INSTRU U-SHP 10X18 (DRAPES) ×1 IMPLANT
DRAPE SHEET LG 3/4 BI-LAMINATE (DRAPES) ×4 IMPLANT
DRSG OPSITE POSTOP 3X4 (GAUZE/BANDAGES/DRESSINGS) ×2 IMPLANT
DURAPREP 26ML APPLICATOR (WOUND CARE) ×3 IMPLANT
ELECT REM PT RETURN 9FT ADLT (ELECTROSURGICAL) ×3
ELECTRODE REM PT RTRN 9FT ADLT (ELECTROSURGICAL) IMPLANT
FILTER SMOKE EVAC LAPAROSHD (FILTER) ×4 IMPLANT
FORCEPS CUTTING 33CM 5MM (CUTTING FORCEPS) ×1 IMPLANT
GAUZE PACKING 2X5 YD STRL (GAUZE/BANDAGES/DRESSINGS) IMPLANT
GAUZE VASELINE 3X9 (GAUZE/BANDAGES/DRESSINGS) IMPLANT
GLOVE SURG ENC MOIS LTX SZ6.5 (GLOVE) ×6 IMPLANT
GLOVE SURG UNDER POLY LF SZ6.5 (GLOVE) ×3 IMPLANT
GLOVE SURG UNDER POLY LF SZ7 (GLOVE) ×12 IMPLANT
HEMOSTAT ARISTA ABSORB 3G PWDR (HEMOSTASIS) ×1 IMPLANT
IRRIG SUCT STRYKERFLOW 2 WTIP (MISCELLANEOUS) ×3
IRRIGATION SUCT STRKRFLW 2 WTP (MISCELLANEOUS) IMPLANT
KIT TURNOVER KIT B (KITS) ×3 IMPLANT
LEGGING LITHOTOMY PAIR STRL (DRAPES) ×2 IMPLANT
NDL INSUFFLATION 14GA 120MM (NEEDLE) IMPLANT
NDL MAYO CATGUT SZ4 TPR NDL (NEEDLE) ×2 IMPLANT
NEEDLE INSUFFLATION 14GA 120MM (NEEDLE) ×3 IMPLANT
NEEDLE MAYO CATGUT SZ4 (NEEDLE) ×3 IMPLANT
NS IRRIG 1000ML POUR BTL (IV SOLUTION) ×3 IMPLANT
PACK LAVH (CUSTOM PROCEDURE TRAY) ×3 IMPLANT
PACK ROBOTIC GOWN (GOWN DISPOSABLE) ×3 IMPLANT
PACK TRENDGUARD 450 HYBRID PRO (MISCELLANEOUS) IMPLANT
PROTECTOR NERVE ULNAR (MISCELLANEOUS) ×6 IMPLANT
SET CYSTO W/LG BORE CLAMP LF (SET/KITS/TRAYS/PACK) ×3 IMPLANT
SET IRRIG TUBING LAPAROSCOPIC (IRRIGATION / IRRIGATOR) IMPLANT
SET TUBE SMOKE EVAC HIGH FLOW (TUBING) ×3 IMPLANT
SHEARS HARMONIC ACE PLUS 36CM (ENDOMECHANICALS) IMPLANT
SLEEVE ENDOPATH XCEL 5M (ENDOMECHANICALS) ×3 IMPLANT
SOLUTION ELECTROLUBE (MISCELLANEOUS) IMPLANT
SPECIMEN JAR MEDIUM (MISCELLANEOUS) ×3 IMPLANT
SUT CHROMIC 0 CT 1 (SUTURE) ×3 IMPLANT
SUT CHROMIC 0 UR 5 27 (SUTURE) IMPLANT
SUT MNCRL AB 3-0 PS2 27 (SUTURE) ×6 IMPLANT
SUT VIC AB 0 CT1 18XCR BRD8 (SUTURE) ×6 IMPLANT
SUT VIC AB 0 CT1 27 (SUTURE) ×3
SUT VIC AB 0 CT1 27XBRD ANBCTR (SUTURE) ×2 IMPLANT
SUT VIC AB 0 CT1 36 (SUTURE) ×3 IMPLANT
SUT VIC AB 0 CT1 8-18 (SUTURE) ×9
SUT VICRYL 0 TIES 12 18 (SUTURE) ×3 IMPLANT
SUT VICRYL 0 UR6 27IN ABS (SUTURE) ×4 IMPLANT
SYR 50ML LL SCALE MARK (SYRINGE) IMPLANT
SYR BULB IRRIG 60ML STRL (SYRINGE) ×4 IMPLANT
SYR TB 1ML 25GX5/8 (SYRINGE) ×3 IMPLANT
TOWEL GREEN STERILE FF (TOWEL DISPOSABLE) ×6 IMPLANT
TRAY FOLEY W/BAG SLVR 14FR (SET/KITS/TRAYS/PACK) ×3 IMPLANT
TRENDGUARD 450 HYBRID PRO PACK (MISCELLANEOUS) ×3
TROCAR BALLN 12MMX100 BLUNT (TROCAR) ×4 IMPLANT
TROCAR XCEL NON-BLD 5MMX100MML (ENDOMECHANICALS) ×3 IMPLANT
UNDERPAD 30X36 HEAVY ABSORB (UNDERPADS AND DIAPERS) ×3 IMPLANT

## 2021-02-07 NOTE — Plan of Care (Signed)
  Problem: Nutrition: Goal: Adequate nutrition will be maintained Outcome: Progressing   Problem: Pain Managment: Goal: General experience of comfort will improve Outcome: Progressing   

## 2021-02-07 NOTE — Transfer of Care (Signed)
Immediate Anesthesia Transfer of Care Note  Patient: Kylie Rivas  Procedure(s) Performed: LAPAROSCOPIC ASSISTED VAGINAL HYSTERECTOMY WITH BILATERAL SALPINGECTOMY (Bilateral: Abdomen) CYSTOSCOPY (Bladder)  Patient Location: PACU  Anesthesia Type:General  Level of Consciousness: awake, alert  and oriented  Airway & Oxygen Therapy: Patient Spontanous Breathing and Patient connected to nasal cannula oxygen  Post-op Assessment: Report given to RN, Post -op Vital signs reviewed and stable, Patient moving all extremities X 4 and Patient able to stick tongue midline  Post vital signs: stable  Last Vitals:  Vitals Value Taken Time  BP 134/86   Temp 99.4   Pulse 97 02/07/21 1228  Resp 15 02/07/21 1228  SpO2 100 % 02/07/21 1228  Vitals shown include unvalidated device data.  Last Pain:  Vitals:   02/07/21 0729  TempSrc:   PainSc: 0-No pain         Complications: No notable events documented.

## 2021-02-07 NOTE — Anesthesia Procedure Notes (Signed)
Procedure Name: Intubation Date/Time: 02/07/2021 9:06 AM Performed by: Cy Blamer, CRNA Pre-anesthesia Checklist: Patient identified, Emergency Drugs available, Suction available and Patient being monitored Patient Re-evaluated:Patient Re-evaluated prior to induction Oxygen Delivery Method: Circle system utilized Preoxygenation: Pre-oxygenation with 100% oxygen Induction Type: IV induction Ventilation: Mask ventilation without difficulty Laryngoscope Size: Miller and 2 Grade View: Grade I Tube type: Oral Tube size: 7.0 mm Number of attempts: 1 Airway Equipment and Method: Stylet and Bite block Placement Confirmation: ETT inserted through vocal cords under direct vision, positive ETCO2 and breath sounds checked- equal and bilateral Secured at: 21 cm Tube secured with: Tape Dental Injury: Teeth and Oropharynx as per pre-operative assessment

## 2021-02-07 NOTE — Anesthesia Preprocedure Evaluation (Addendum)
Anesthesia Evaluation  Patient identified by MRN, date of birth, ID band Patient awake    Reviewed: Allergy & Precautions, NPO status , Patient's Chart, lab work & pertinent test results  History of Anesthesia Complications Negative for: history of anesthetic complications  Airway Mallampati: I  TM Distance: >3 FB Neck ROM: Full    Dental  (+) Teeth Intact, Dental Advisory Given,    Pulmonary asthma , Current Smoker and Patient abstained from smoking.,    breath sounds clear to auscultation       Cardiovascular  Rhythm:Regular     Neuro/Psych  Headaches, negative psych ROS   GI/Hepatic Neg liver ROS, PUD, GERD  Medicated and Controlled,  Endo/Other  negative endocrine ROS  Renal/GU negative Renal ROS     Musculoskeletal negative musculoskeletal ROS (+)   Abdominal   Peds  Hematology negative hematology ROS (+) Lab Results      Component                Value               Date                      WBC                      7.0                 01/31/2021                HGB                      12.1                01/31/2021                HCT                      37.9                01/31/2021                MCV                      82.8                01/31/2021                PLT                      310                 01/31/2021              Anesthesia Other Findings   Reproductive/Obstetrics Lab Results      Component                Value               Date                      PREGTESTUR               NEGATIVE            02/07/2021                HCG                      <  5.0                11/08/2018                                       Anesthesia Physical Anesthesia Plan  ASA: 2  Anesthesia Plan: General   Post-op Pain Management: Toradol IV (intra-op) and Ofirmev IV (intra-op)   Induction: Intravenous  PONV Risk Score and Plan: 2 and Ondansetron and  Dexamethasone  Airway Management Planned: Oral ETT  Additional Equipment: None  Intra-op Plan:   Post-operative Plan: Extubation in OR  Informed Consent: I have reviewed the patients History and Physical, chart, labs and discussed the procedure including the risks, benefits and alternatives for the proposed anesthesia with the patient or authorized representative who has indicated his/her understanding and acceptance.     Dental advisory given  Plan Discussed with: CRNA and Anesthesiologist  Anesthesia Plan Comments:         Anesthesia Quick Evaluation

## 2021-02-07 NOTE — Op Note (Addendum)
Pre op diagnosis: menorrhagia.    Postop Diagnosis:same   Procedure: LAVH, B salpingectmy , Cystoscopy  Anesthesia: General   Anesthesiologist:   Attending: Michael Litter, MD   Assistant: Osborn Coho MD  Findings: normal size uterus.  Normal appearing ovaries.    Pathology: uterus and cervix and bilateral tubes  Fluids: 1100 cccrystalloid  UOP: 700cc clear at the end of procedure  EBL: 100cc  Complications:none  Procedure: The patient was taken to the operating room, placed under general anesthesia and prepped and draped in the normal sterile fashion. A Foley catheter was placed in the bladder. A weighted speculum and vaginal retractors were placed in the vagina. Tenaculum was placed on the anterior lip of the cervix.  A hulka manipulator was placed in the uterus. Attention was then turned to the abdomen. A 10 mm infraumbilical incision was made with the scalpel after 5 cc of 25% percent Marcaine was used for local anesthesia. The subcutaneous tissue was dissected and the fascia was incised with the knife. A purse string stitch was placed in the fascia and Hassan placed into the intra-abdominal cavity and anchored to the suture. I could not see into the abdomen from an adhesion  she had a prior umbilical hernia repair.   I then placed a veres in the left uppper quadrant once an OG tube was placed.   A fivemm scope was placed under direct visualization.  I could see the [facility] in SQ tissue.  Gas was also present in the SQ tissue.  I then did a dissection once again at the umbilicus and entered the cavity with no problems.  A second hassan had to be used because the baloon did not inflate.  Intraabdominal placement was confirmed with the laparoscope.  Two 5 mm trochars were placed in the right and left lower quadrants under direct visualization with the laparoscope.   .   Both round ligaments were cauterized and cut with the gyrus bipolar cautery as well and the bladder flap created with  the gyrus and hydrodisection using the nigat and removed away from the uterus. The bladder adhesions were also lysed sharply.   The left fallopian tube was cauterized and removed.    The left uterine ovarian ligament was then cauterized and cut.  The right fallopian tube was cauterized cut and removed.  The right utero-ovarian ligament was cauterized and cut with the tripolar cautery gyrus.  Attention was then turned to the vagina.  A weighted speculum was placed in the posterior fourchette.  petrussin mixture was placed circumferentially around the cervix.  With blunt and sharp dissection the cervix was dissected away from the bowel and bladder.  Both uterosacral ligaments were clamped, cut and suture ligated and held.  The anterior and posterior culdesac was entered sharply using metzenbaum scissors.  The cardinal ligaments and  The uterine arteries were clamped, cut and suture ligated bilaterally.    The uterus was then delivered.  The mcall suture was placed.   The vaginal cuff was closed with interrupted suture of 0 chromic.    Cystoscopy was performed and both ureters were seen to efflux indigo carmine without difficulty. The bladder had full integrity with no suture or laceration visualized.  The vagina was inspected and the cuff was noted to be intact.  Attention was then turned back to the abdomen after removing top pair of gloves. The abdomen was reinsufflated with CO2 gas.   The abdomen and pelvis was copiously irrigated.  hemostasis was noted.  arista was placed along the cuff.    All trochars were removed under direct visualization using the laparoscope.  The umbilical fascia was reapproximated by tying the circumferential suture. The three  5 mm incisions were closed withdermabond.   All remaining skin incisions were closed with Dermabond and the 10 mm skin incisions were reinforced using Dermabond.  Sponge lap and needle counts were correct.  The patient tolerated the procedure well and was  returned to the PACU in stable condition  .  My assistant was needed for the entire surgery.

## 2021-02-08 ENCOUNTER — Encounter: Payer: No Typology Code available for payment source | Admitting: Nurse Practitioner

## 2021-02-08 ENCOUNTER — Encounter (HOSPITAL_COMMUNITY): Payer: Self-pay | Admitting: Obstetrics and Gynecology

## 2021-02-08 DIAGNOSIS — N870 Mild cervical dysplasia: Secondary | ICD-10-CM | POA: Diagnosis not present

## 2021-02-08 LAB — BASIC METABOLIC PANEL
Anion gap: 12 (ref 5–15)
BUN: 9 mg/dL (ref 6–20)
CO2: 22 mmol/L (ref 22–32)
Calcium: 8.7 mg/dL — ABNORMAL LOW (ref 8.9–10.3)
Chloride: 103 mmol/L (ref 98–111)
Creatinine, Ser: 0.66 mg/dL (ref 0.44–1.00)
GFR, Estimated: >60 mL/min (ref >60)
Glucose, Bld: 113 mg/dL — ABNORMAL HIGH (ref 70–99)
Potassium: 3.5 mmol/L (ref 3.5–5.1)
Sodium: 137 mmol/L (ref 135–145)

## 2021-02-08 LAB — CBC
HCT: 35.9 % — ABNORMAL LOW (ref 36.0–46.0)
Hemoglobin: 11.7 g/dL — ABNORMAL LOW (ref 12.0–15.0)
MCH: 26.1 pg (ref 26.0–34.0)
MCHC: 32.6 g/dL (ref 30.0–36.0)
MCV: 80.1 fL (ref 80.0–100.0)
Platelets: 295 10*3/uL (ref 150–400)
RBC: 4.48 MIL/uL (ref 3.87–5.11)
RDW: 12.8 % (ref 11.5–15.5)
WBC: 13.6 10*3/uL — ABNORMAL HIGH (ref 4.0–10.5)
nRBC: 0 % (ref 0.0–0.2)

## 2021-02-08 MED ORDER — KETOROLAC TROMETHAMINE 10 MG PO TABS: 10.0000 mg | ORAL_TABLET | Freq: Four times a day (QID) | ORAL | Status: AC | PRN

## 2021-02-08 MED ORDER — SIMETHICONE 80 MG PO CHEW
80.0000 mg | CHEWABLE_TABLET | Freq: Four times a day (QID) | ORAL | 0 refills | Status: DC | PRN
Start: 2021-02-08 — End: 2022-04-02

## 2021-02-08 MED ORDER — METOCLOPRAMIDE HCL 5 MG/ML IJ SOLN: 10.0000 mg | Freq: Four times a day (QID) | INTRAMUSCULAR | 0 refills | Status: DC

## 2021-02-08 MED ORDER — PROMETHAZINE HCL 12.5 MG PO TABS: 12.5000 mg | ORAL_TABLET | Freq: Four times a day (QID) | ORAL | 0 refills | Status: DC | PRN

## 2021-02-08 NOTE — Progress Notes (Signed)
Discharge instructions (including medications) discussed with and copy provided to patient/caregiver 

## 2021-02-08 NOTE — Progress Notes (Deleted)
This visit occurred during the SARS-CoV-2 public health emergency.  Safety protocols were in place, including screening questions prior to the visit, additional usage of staff PPE, and extensive cleaning of exam room while observing appropriate contact time as indicated for disinfecting solutions.  Subjective:     Patient ID: Kylie Rivas , female    DOB: 1981-01-28 , 40 y.o.   MRN: [id number]   Chief Complaint  Patient presents with   Gynecologic Exam    HPI  Patient presents today for a 6 month repeat pap smear.   Gynecologic Exam    Past Medical History:  Diagnosis Date   Allergy    Bee stings, shellfish   Anemia    Asthma    winter time.  Last episode 2017   Family history of adverse reaction to anesthesia    Patient states her mother had a hard time waking up from anesthesia   GERD (gastroesophageal reflux disease)    Headache    Migraines   Heart murmur    Childhood   Stomach ulcer      Family History  Problem Relation Age of Onset   Diabetes Mother    Hypertension Mother    Seizures Mother        due to strokes in past   Stroke Mother    Kidney disease Mother        recurrent kidney stones--unclear what type   Heart disease Mother        Ischemic Heart Disease   Pulmonary embolism Mother        unknown clotting disorder.  Patient states she was tested for same and did not have.   Diabetes Father    Heart disease Father        MI   Stroke Father    Asthma Sister    Hypertension Sister    Cancer Brother        Primary lung, but met to nasopharyngeal area.    No current facility-administered medications for this visit. No current outpatient medications on file.  Facility-Administered Medications Ordered in Other Visits:    diphenhydrAMINE (BENADRYL) injection 12.5 mg, 12.5 mg, Intravenous, Q6H PRN **OR** diphenhydrAMINE (BENADRYL) 12.5 MG/5ML elixir 12.5 mg, 12.5 mg, Oral, Q6H PRN, Dillard, Naima, MD   famotidine (PEPCID) tablet 20 mg,  20 mg, Oral, BID, Dillard, Naima, MD, 20 mg at 02/07/21 2114   HYDROmorphone (DILAUDID) 1 mg/mL PCA injection, , Intravenous, Q4H, Dillard, Naima, MD, Set-up / Initial Syringe at 02/07/21 1420   lactated ringers infusion, , Intravenous, Continuous, Dillard, Naima, MD, Last Rate: 125 mL/hr at 02/08/21 0529, Infusion Verify at 02/08/21 0529   loratadine (CLARITIN) tablet 10 mg, 10 mg, Oral, Daily, Dillard, Naima, MD, 10 mg at 02/07/21 1825   menthol-cetylpyridinium (CEPACOL) lozenge 3 mg, 1 lozenge, Oral, Q2H PRN, Dillard, Naima, MD   metoCLOPramide (REGLAN) injection 10 mg, 10 mg, Intravenous, Q6H, Dillard, Naima, MD, 10 mg at 02/08/21 0505   naloxone (NARCAN) injection 0.4 mg, 0.4 mg, Intravenous, PRN **AND** sodium chloride flush (NS) 0.9 % injection 9 mL, 9 mL, Intravenous, PRN, Dillard, Naima, MD   ondansetron (ZOFRAN) tablet 4 mg, 4 mg, Oral, Q6H PRN **OR** ondansetron (ZOFRAN) injection 4 mg, 4 mg, Intravenous, Q6H PRN, Dillard, Naima, MD   oxyCODONE-acetaminophen (PERCOCET/ROXICET) 5-325 MG per tablet 1-2 tablet, 1-2 tablet, Oral, Q4H PRN, Dillard, Naima, MD   simethicone (MYLICON) chewable tablet 80 mg, 80 mg, Oral, QID PRN, Crawford Givens, MD   Allergies  Allergen Reactions  Aspirin Anaphylaxis    Patient states, "I can tolerate ketorlac"   Iodinated Diagnostic Agents Anaphylaxis   Shellfish Allergy Shortness Of Breath and Rash     Review of Systems   There were no vitals filed for this visit. There is no height or weight on file to calculate BMI.   Objective:  Physical Exam      Assessment And Plan:     There are no diagnoses linked to this encounter.    Patient was given opportunity to ask questions. Patient verbalized understanding of the plan and was able to repeat key elements of the plan. All questions were answered to their satisfaction.  Sheppard Evens [place], CMA   I, Roberta, CMA, have reviewed all documentation for this visit. The documentation on  02/08/21 for the exam, diagnosis, procedures, and orders are all accurate and complete.   IF YOU HAVE BEEN REFERRED TO A SPECIALIST, IT MAY TAKE 1-2 WEEKS TO SCHEDULE/PROCESS THE REFERRAL. IF YOU HAVE NOT HEARD FROM US/SPECIALIST IN TWO WEEKS, PLEASE GIVE [place] A CALL AT [phone].   THE PATIENT IS ENCOURAGED TO PRACTICE SOCIAL DISTANCING DUE TO THE COVID-19 PANDEMIC.

## 2021-02-08 NOTE — Progress Notes (Signed)
27 ml PCA Dilaudid wasted in Steribin with Israa, pts. Nurse.

## 2021-02-08 NOTE — Plan of Care (Signed)
  Problem: Education: Goal: Knowledge of General Education information will improve Description: Including pain rating scale, medication(s)/side effects and non-pharmacologic comfort measures Outcome: Adequate for Discharge   

## 2021-02-08 NOTE — Discharge Summary (Signed)
Physician Discharge Summary  Patient ID: Kylie Rivas MRN: [id number] DOB/AGE: [birth year] 39 y.o.  Admit date: 02/07/2021 Discharge date: 02/08/2021  Admission Diagnoses:  Discharge Diagnoses:  Principal Problem:   Menorrhagia Active Problems:   Dysfunctional uterine bleeding   S/P laparoscopic assisted vaginal hysterectomy (LAVH)   Discharged Condition: good  Hospital Course: Pt came in for LAVH for menorrhagia.  All went well.  She had some n/v that resolved and only desires to go home with toradol.  She will take Motrin afterwards if needed.  Pt is stable for discharge.    Consults: None  Significant Diagnostic Studies: see chart  Treatments: IV hydration, antibiotics: Ancef, and surgery: lavh cysto  Discharge Exam: Blood pressure (!) 121/54, pulse 79, temperature 98.4 F (36.9 C), temperature source Oral, resp. rate 14, last menstrual period 01/21/2021, SpO2 100 %. General appearance: alert and cooperative Cardio: regular rate and rhythm GI: soft, non-tender; bowel sounds normal; no masses,  no organomegaly Pelvic: external genitalia normal and no heavy bleeding Incision/Wound: incisions CDI  Disposition: Discharge disposition: 01-Home or Self Care        Allergies as of 02/08/2021       Reactions   Aspirin Anaphylaxis   Patient states, "I can tolerate ketorlac"   Iodinated Diagnostic Agents Anaphylaxis   Shellfish Allergy Shortness Of Breath, Rash        Medication List     STOP taking these medications    ibuprofen 200 MG tablet Commonly known as: ADVIL       TAKE these medications    famotidine 20 MG tablet Commonly known as: PEPCID Take 20 mg by mouth in the morning, at noon, and at bedtime.   fexofenadine 180 MG tablet Commonly known as: ALLEGRA Take 180 mg by mouth daily.   metoCLOPramide 5 MG/ML injection Commonly known as: REGLAN Inject 2 mLs (10 mg total) into the vein every 6 (six) hours.   multivitamin with  minerals Tabs tablet Take 1 tablet by mouth daily.   promethazine 12.5 MG tablet Commonly known as: PHENERGAN Take 1 tablet (12.5 mg total) by mouth every 6 (six) hours as needed for nausea or vomiting.   simethicone 80 MG chewable tablet Commonly known as: MYLICON Chew 1 tablet (80 mg total) by mouth 4 (four) times daily as needed for flatulence.   Vitamin D 125 MCG (5000 UT) Caps Take 10,000 Units by mouth daily.        Follow-up Information     Kylie Chisom, MD. Schedule an appointment as soon as possible for a visit in 6 week(s).   Specialty: Obstetrics and Gynecology Contact information: [address] [place] [address] [phone]                 Signed: Michael Litter 02/08/2021, 9:08 AM

## 2021-02-08 NOTE — Anesthesia Postprocedure Evaluation (Signed)
Anesthesia Post Note  Patient: Kamera Underwood-Hazley  Procedure(s) Performed: LAPAROSCOPIC ASSISTED VAGINAL HYSTERECTOMY WITH BILATERAL SALPINGECTOMY (Bilateral: Abdomen) CYSTOSCOPY (Bladder)     Patient location during evaluation: PACU Anesthesia Type: General Level of consciousness: awake and alert Pain management: pain level controlled Vital Signs Assessment: post-procedure vital signs reviewed and stable Respiratory status: spontaneous breathing, nonlabored ventilation, respiratory function stable and patient connected to nasal cannula oxygen Cardiovascular status: blood pressure returned to baseline and stable Postop Assessment: no apparent nausea or vomiting Anesthetic complications: no   No notable events documented.  Last Vitals:  Vitals:   02/08/21 0430 02/08/21 0821  BP: 130/73 (!) 121/54  Pulse: 98 79  Resp: 18 14  Temp: 37.4 C 36.9 C  SpO2: 98% 100%    Last Pain:  Vitals:   02/08/21 0821  TempSrc: Oral  PainSc: 2                  Rector Devonshire

## 2021-02-09 LAB — SURGICAL PATHOLOGY

## 2021-03-23 NOTE — Progress Notes (Signed)
No show

## 2021-08-09 ENCOUNTER — Encounter: Payer: No Typology Code available for payment source | Admitting: Nurse Practitioner

## 2021-12-10 ENCOUNTER — Other Ambulatory Visit: Payer: Self-pay

## 2021-12-10 ENCOUNTER — Emergency Department (HOSPITAL_BASED_OUTPATIENT_CLINIC_OR_DEPARTMENT_OTHER): Payer: Managed Care, Other (non HMO)

## 2021-12-10 ENCOUNTER — Emergency Department (HOSPITAL_COMMUNITY): Payer: Managed Care, Other (non HMO)

## 2021-12-10 ENCOUNTER — Encounter (HOSPITAL_BASED_OUTPATIENT_CLINIC_OR_DEPARTMENT_OTHER): Payer: Self-pay

## 2021-12-10 ENCOUNTER — Emergency Department (HOSPITAL_BASED_OUTPATIENT_CLINIC_OR_DEPARTMENT_OTHER)
Admission: EM | Admit: 2021-12-10 | Discharge: 2021-12-11 | Disposition: A | Discharge status: Home / Self Care | Payer: Managed Care, Other (non HMO) | Attending: Emergency Medicine | Admitting: Emergency Medicine

## 2021-12-10 ENCOUNTER — Encounter (HOSPITAL_COMMUNITY): Payer: Self-pay

## 2021-12-10 DIAGNOSIS — J45909 Unspecified asthma, uncomplicated: Secondary | ICD-10-CM | POA: Insufficient documentation

## 2021-12-10 DIAGNOSIS — Z79899 Other long term (current) drug therapy: Secondary | ICD-10-CM | POA: Diagnosis not present

## 2021-12-10 DIAGNOSIS — R519 Headache, unspecified: Secondary | ICD-10-CM | POA: Diagnosis present

## 2021-12-10 LAB — CBC
HCT: 39.3 % (ref 36.0–46.0)
Hemoglobin: 12.8 g/dL (ref 12.0–15.0)
MCH: 25.9 pg — ABNORMAL LOW (ref 26.0–34.0)
MCHC: 32.6 g/dL (ref 30.0–36.0)
MCV: 79.6 fL — ABNORMAL LOW (ref 80.0–100.0)
Platelets: 291 10*3/uL (ref 150–400)
RBC: 4.94 MIL/uL (ref 3.87–5.11)
RDW: 12.7 % (ref 11.5–15.5)
WBC: 9.1 10*3/uL (ref 4.0–10.5)
nRBC: 0 % (ref 0.0–0.2)

## 2021-12-10 LAB — BASIC METABOLIC PANEL
Anion gap: 9 (ref 5–15)
BUN: 18 mg/dL (ref 6–20)
CO2: 24 mmol/L (ref 22–32)
Calcium: 9.6 mg/dL (ref 8.9–10.3)
Chloride: 104 mmol/L (ref 98–111)
Creatinine, Ser: 0.91 mg/dL (ref 0.44–1.00)
GFR, Estimated: >60 mL/min (ref >60)
Glucose, Bld: 97 mg/dL (ref 70–99)
Potassium: 4.1 mmol/L (ref 3.5–5.1)
Sodium: 137 mmol/L (ref 135–145)

## 2021-12-10 MED ORDER — GADOBUTROL 1 MMOL/ML IV SOLN
8.0000 mL | Freq: Once | INTRAVENOUS | Status: AC | PRN
  Administered 2021-12-10: 8 mL via INTRAVENOUS

## 2021-12-10 NOTE — ED Notes (Signed)
Right side headache, over the last month and improve slightly with medication.

## 2021-12-10 NOTE — ED Notes (Signed)
IV secured for transport to [facility]. Provider approved.

## 2021-12-10 NOTE — ED Notes (Signed)
Doren Custard, MD was the accepting.

## 2021-12-10 NOTE — ED Notes (Signed)
Report given to the RN at [facility].

## 2021-12-10 NOTE — ED Triage Notes (Signed)
Pt arrives POV from home with c/o headache for approximately one month.  Pt says she comes in today because she is concerned she has been having a headache for so long, and she has been taking Ibuprofen 1000 mg about 2x/day.  She describes pain on the top right side of her head, sometimes there is nausea and vomiting, and she says it is affecting her vision, stating she has intermittent blurry vision.  She says today her headache is not intense today, but it is constant.  Pt ambulatory to triage, in NAD, GCS 15.

## 2021-12-10 NOTE — ED Provider Notes (Signed)
[facility] EMERGENCY DEPT Provider Note   CSN: [id number] Arrival date & time: 12/10/21  1721     History  Chief Complaint  Patient presents with   Headache    Kylie Rivas is a 41 y.o. female with past medical history significant for asthma, allergies, menorrhagia status post hysterectomy, previous migraine disorder, Arnold-Chiari malformation who presents with concern for intermittent but nearly constant headache for around a month.  Patient reports the headaches are sometimes activated with no cause whatsoever, describing waking up with severe headache and vomiting a few nights ago, reports that she additionally has severe headaches every time she climaxess with sex.  Patient reports that she was having severe headaches, had seen a neurologist regarding her migraine disorder prior to her first child, due to her history of Arnold-Chiari malformation they discussed possible surgical intervention, reports that after she became pregnant she stopped having severe headaches, headaches started again possibly associated with recent hysterectomy.  She She has been taking at 1000 mg of ibuprofen at a time twice a day and "knows that that is not appropriate".  She reports she has some visual program, nausea, photosensitivity when she is having more severe migraines, denies any visual changes, numbness, weakness, confusion at this time.  She denies any dizziness or balance issues.   Headache      Home Medications Prior to Admission medications   Medication Sig Start Date End Date Taking? Authorizing Provider  Cholecalciferol (VITAMIN D) 125 MCG (5000 UT) CAPS Take 10,000 Units by mouth daily.    [provider]  famotidine (PEPCID) 20 MG tablet Take 20 mg by mouth in the morning, at noon, and at bedtime.    [provider]  fexofenadine (ALLEGRA) 180 MG tablet Take 180 mg by mouth daily.    [provider]  metoCLOPramide (REGLAN) 5 MG/ML  injection Inject 2 mLs (10 mg total) into the vein every 6 (six) hours. 02/08/21   Jaymes Graff, MD  Multiple Vitamin (MULTIVITAMIN WITH MINERALS) TABS tablet Take 1 tablet by mouth daily.    [provider]  promethazine (PHENERGAN) 12.5 MG tablet Take 1 tablet (12.5 mg total) by mouth every 6 (six) hours as needed for nausea or vomiting. 02/08/21   Jaymes Graff, MD  simethicone (MYLICON) 80 MG chewable tablet Chew 1 tablet (80 mg total) by mouth 4 (four) times daily as needed for flatulence. 02/08/21   Jaymes Graff, MD  dicyclomine (BENTYL) 20 MG tablet 1 tab by mouth every 6 hours as needed for cramping Patient not taking: Reported on 11/08/2018 01/09/18 11/08/18  Julieanne Manson, MD      Allergies    Aspirin, Iodinated contrast media, and Shellfish allergy    Review of Systems   Review of Systems  Neurological:  Positive for headaches.  All other systems reviewed and are negative.   Physical Exam Updated Vital Signs BP 112/63 (BP Location: Right Arm)   Pulse 91   Temp 98 F (36.7 C) (Oral)   Resp 16   Ht 5\' 4"  (1.626 m)   Wt 80.7 kg   LMP 01/21/2021 (Exact Date)   SpO2 100%   BMI 30.55 kg/m  Physical Exam Vitals and nursing note reviewed.  Constitutional:      General: She is not in acute distress.    Appearance: Normal appearance.  HENT:     Head: Normocephalic and atraumatic.  Eyes:     General:        Right eye: No discharge.  Left eye: No discharge.  Cardiovascular:     Rate and Rhythm: Normal rate and regular rhythm.     Heart sounds: No murmur heard.    No friction rub. No gallop.  Pulmonary:     Effort: Pulmonary effort is normal.     Breath sounds: Normal breath sounds.  Abdominal:     General: Bowel sounds are normal.     Palpations: Abdomen is soft.  Skin:    General: Skin is warm and dry.     Capillary Refill: Capillary refill takes less than 2 seconds.  Neurological:     Mental Status: She is alert and oriented to person, place,  and time.     Comments: Cranial nerves II through XII grossly intact.  Intact finger-nose, intact heel-to-shin.  Romberg negative, gait normal.  Alert and oriented x3.  Moves all 4 limbs spontaneously, normal coordination.  No pronator drift.  Intact strength 5 out of 5 bilateral upper and lower extremities.    Psychiatric:        Mood and Affect: Mood normal.        Behavior: Behavior normal.     ED Results / Procedures / Treatments   Labs (all labs ordered are listed, but only abnormal results are displayed) Labs Reviewed  CBC - Abnormal; Notable for the following components:      Result Value   MCV 79.6 (*)    MCH 25.9 (*)    All other components within normal limits  BASIC METABOLIC PANEL    EKG None  Radiology CT Head Wo Contrast  Result Date: 12/10/2021 CLINICAL DATA:  Headache, chronic, new features or increased frequency EXAM: CT HEAD WITHOUT CONTRAST TECHNIQUE: Contiguous axial images were obtained from the base of the skull through the vertex without intravenous contrast. RADIATION DOSE REDUCTION: This exam was performed according to the departmental dose-optimization program which includes automated exposure control, adjustment of the mA and/or kV according to patient size and/or use of iterative reconstruction technique. COMPARISON:  None Available. FINDINGS: Brain: No intracranial hemorrhage, mass effect, or midline shift. No hydrocephalus. The basilar cisterns are patent. No evidence of territorial infarct or acute ischemia. Partially empty sella. No extra-axial or intracranial fluid collection. Vascular: No hyperdense vessel or unexpected calcification. Skull: No fracture or focal lesion. Sinuses/Orbits: Paranasal sinuses and mastoid air cells are clear. The visualized orbits are unremarkable. Other: None. IMPRESSION: 1. No acute intracranial abnormality. 2. Partially empty sella, typically incidental but can be seen with idiopathic intracranial hypertension. Electronically  Signed   By: Keith Rake M.D.   On: 12/10/2021 20:05    Procedures Procedures    Medications Ordered in ED Medications - No data to display  ED Course/ Medical Decision Making/ A&P                           Medical Decision Making Amount and/or Complexity of Data Reviewed Labs: ordered. Radiology: ordered.   This patient is a 41 y.o. female who presents to the ED for concern of headache for a month, this involves an extensive number of treatment options, and is a complaint that carries with it a high risk of complications and morbidity. The emergent differential diagnosis prior to evaluation includes, but is not limited to, dural venous thrombosis, complication related to the Arnold-Chiari malformation, vasospasm, reversible vasoconstriction, stroke, subarachnoid hemorrhage, TIA, migraine, tension type headache versus other.   This is not an exhaustive differential.   Past Medical History /  Co-morbidities / Social History: Patient with history of recurrent migraines, Arnold-Chiari malformation  Additional history: Chart reviewed. Pertinent results include: Reviewed outpatient PCP, gynecology, remote labwork, imaging from emergency department sent imaging or lab work on file  Physical Exam: Physical exam performed. The pertinent findings include: Patient is neurovascularly intact on my exam, with no significant deficits at this time, she does describe been intermittent vision blurring.  She is not hypertensive, afebrile.  Lab Tests: I ordered, and personally interpreted labs.  The pertinent results include: CBC unremarkable, BMP unremarkable.   Imaging Studies: I ordered imaging studies including CT head without contrast. I independently visualized and interpreted imaging which showed questionable empty sella consistent with possible IIH in right clinical context. I agree with the radiologist interpretation.  Consultations Obtained: I requested consultation with the  neurologist, spoke with Dr. Lorrin Goodell,  and discussed lab and imaging findings as well as pertinent plan - they recommend: MRI brain with and without contrast at this time, as we do not have an MRI present we will transfer patient to [facility], spoke with Dr. Doren Custard who accepts transfer of patient.  This patient has not received any narcotic pain medication and vital signs are stable at this time we will send POV.  Discharge and triage nurse made aware and MR ordered ahead of time.   Disposition: After consideration of the diagnostic results and the patients response to treatment, I feel that patient would benefit from further evaluation at [facility] per neurology recommendations.   I discussed this case with my attending physician Dr. Maryan Rued who cosigned this note including patient's presenting symptoms, physical exam, and planned diagnostics and interventions. Attending physician stated agreement with plan or made changes to plan which were implemented.    Final Clinical Impression(s) / ED Diagnoses Final diagnoses:  None    Rx / DC Orders ED Discharge Orders     None         Dorien Chihuahua 12/10/21 2223    Blanchie Dessert, MD 12/13/21 1743

## 2021-12-11 NOTE — ED Notes (Addendum)
Patient is in no acute distress breathing spontaneously to room air . Denies complaints at this time . Pending MRI results.  . Complains of IV irritation . IV flushed with 10 ML of NS. IV is patent no s/s of IV complications noted .

## 2021-12-11 NOTE — ED Provider Notes (Signed)
I assumed care of this patient.  Please see previous provider note for further details of Hx, PE.  Briefly patient is a 41 y.o. female who presented as transfer from [facility] for MRI.  Headache improved with sleep. MRI negative.  The patient appears reasonably screened and/or stabilized for discharge and I doubt any other medical condition or other [facility] requiring further screening, evaluation, or treatment in the ED at this time. I have discussed the findings, Dx and Tx plan with the patient/family who expressed understanding and agree(s) with the plan. Discharge instructions discussed at length. The patient/family was given strict return precautions who verbalized understanding of the instructions. No further questions at time of discharge.  Disposition: Discharge  Condition: Good  ED Discharge Orders     None        Follow Up: [facility] [place] [address] [phone]  Call  to schedule an appointment for close follow up          Jhordan Kinter, Grayce Sessions, MD 12/11/21 [phone]

## 2021-12-12 ENCOUNTER — Encounter: Payer: Self-pay | Admitting: Neurology

## 2021-12-12 ENCOUNTER — Ambulatory Visit: Payer: Managed Care, Other (non HMO) | Admitting: Neurology

## 2021-12-12 ENCOUNTER — Telehealth: Payer: Self-pay | Admitting: Neurology

## 2021-12-12 VITALS — BP 109/75 | HR 86 | Ht 64.6 in | Wt 177.5 lb

## 2021-12-12 DIAGNOSIS — G4482 Headache associated with sexual activity: Secondary | ICD-10-CM

## 2021-12-12 DIAGNOSIS — G935 Compression of brain: Secondary | ICD-10-CM | POA: Diagnosis not present

## 2021-12-12 DIAGNOSIS — G4486 Cervicogenic headache: Secondary | ICD-10-CM | POA: Diagnosis not present

## 2021-12-12 DIAGNOSIS — G4459 Other complicated headache syndrome: Secondary | ICD-10-CM

## 2021-12-12 MED ORDER — GABAPENTIN 300 MG PO CAPS: 300.0000 mg | ORAL_CAPSULE | Freq: Two times a day (BID) | ORAL | 0 refills | Status: DC

## 2021-12-12 NOTE — Telephone Encounter (Signed)
Referral for Neurosurgery sent to [facility]. Phone: [phone], Fax: [phone]

## 2021-12-12 NOTE — Patient Instructions (Signed)
Refer to nurse neurosurgery for surgical option Trial of gabapentin 300 mg twice daily Physical therapy for cervicogenic headache Recommended massages and acupuncture Follow-up in 3 months or sooner if worse

## 2021-12-12 NOTE — Progress Notes (Signed)
[facility]  PATIENT: Kylie Rivas DOB: Dec 15, 1980  REQUESTING CLINICIAN: No ref. provider found HISTORY FROM: Patient  REASON FOR VISIT: New constant headaches for the past month    HISTORICAL  CHIEF COMPLAINT:  Chief Complaint  Patient presents with   New Patient (Initial Visit)    Rm 12. Alone. ED referral for headache, possible IIH.    HISTORY OF PRESENT ILLNESS:  This is a 41 year old woman with no reported past medical history who is presenting with complaint of constant headache for the past month.  She reports for the past month, every day she will wake up with a headache. The headaches are always on the right side, sometimes on top of the head and also in the back of the head.  On top of that she also complaints of cervicogenic headache.  She has tried medications including Tylenol without relief and currently she is taking ibuprofen 1000 mg twice a day.  She is aware that this is not good for her.   In further history she reported having headaches since childhood. She said in college she was having severe headache to the point of passing out and she saw neurology, at that time and was diagnosed with Chiari malformation.  She was recommended surgery but she did not proceed with the evaluation.  She reported during her pregnancies she was doing well until her last child was born and that the headache reoccur.  She said the past couple years she was having headaches around her menses, she was having strong bleeding then in December she had a hysterectomy.  Since having the hysterectomy, the headache frequency increased to the point that now it is constant in the past month.   Headache History and Characteristics: Onset: Sicne childhood but now worse after her hysterectomy in December Location: Top of head and back of head Quality:  Thobbing and pressure Intensity:10 /10.  Duration: Continuous for the past month  Migrainous Features:  Photophobia, phonophobia, nausea, vomiting.  Aura: No  History of brain injury or tumor: No  Family history: Motion sickness: no Cardiac history: no  OTC: tylenol, codeine, Ibupofen    Prior prophylaxis: Propranolol: No  Verapamil:No TCA: No Topamax: No Depakote: No Effexor: No Cymbalta: No Neurontin:No  Prior abortives: Triptan: No Anti-emetic: No Steroids: No Ergotamine suppository: No    OTHER MEDICAL CONDITIONS: None reported    REVIEW OF SYSTEMS: Full 14 system review of systems performed and negative with exception of: As noted in the HPI   ALLERGIES: Allergies  Allergen Reactions   Aspirin Anaphylaxis    Patient states, "I can tolerate ketorlac"   Iodinated Contrast Media Anaphylaxis   Other Anaphylaxis, Hives and Shortness Of Breath    Bee Stings   Shellfish Allergy Shortness Of Breath and Rash    HOME MEDICATIONS: Outpatient Medications Prior to Visit  Medication Sig Dispense Refill   Cholecalciferol (VITAMIN D) 125 MCG (5000 UT) CAPS Take 10,000 Units by mouth daily.     famotidine (PEPCID) 20 MG tablet Take 20 mg by mouth in the morning, at noon, and at bedtime.     fexofenadine (ALLEGRA) 180 MG tablet Take 180 mg by mouth daily.     ibuprofen (ADVIL) 200 MG tablet Take 800 mg by mouth every 6 (six) hours as needed.     Multiple Vitamin (MULTIVITAMIN WITH MINERALS) TABS tablet Take 1 tablet by mouth daily.     simethicone (MYLICON) 80 MG chewable tablet Chew 1 tablet (80 mg total) by  mouth 4 (four) times daily as needed for flatulence. 30 tablet 0   metoCLOPramide (REGLAN) 5 MG/ML injection Inject 2 mLs (10 mg total) into the vein every 6 (six) hours. 2 mL 0   promethazine (PHENERGAN) 12.5 MG tablet Take 1 tablet (12.5 mg total) by mouth every 6 (six) hours as needed for nausea or vomiting. 30 tablet 0   No facility-administered medications prior to visit.    PAST MEDICAL HISTORY: Past Medical History:  Diagnosis Date   Allergy    Bee stings,  shellfish   Anemia    Asthma    winter time.  Last episode 2017   Family history of adverse reaction to anesthesia    Patient states her mother had a hard time waking up from anesthesia   GERD (gastroesophageal reflux disease)    Headache    Migraines   Heart murmur    Childhood   Stomach ulcer     PAST SURGICAL HISTORY: Past Surgical History:  Procedure Laterality Date   ANKLE SURGERY Right 2003   ligament repair   ANKLE SURGERY Left 2009   LIgament repair   CYSTOSCOPY  02/07/2021   Procedure: CYSTOSCOPY;  Surgeon: Crawford Givens, MD;  Location: [place];  Service: Gynecology;;   HERNIA REPAIR  3500   umbilical   LAPAROSCOPIC VAGINAL HYSTERECTOMY WITH SALPINGECTOMY Bilateral 02/07/2021   Procedure: LAPAROSCOPIC ASSISTED VAGINAL HYSTERECTOMY WITH BILATERAL SALPINGECTOMY;  Surgeon: Crawford Givens, MD;  Location: [place];  Service: Gynecology;  Laterality: Bilateral;   TUBAL LIGATION  2010    FAMILY HISTORY: Family History  Problem Relation Age of Onset   Diabetes Mother    Hypertension Mother    Seizures Mother        due to strokes in past   Stroke Mother    Kidney disease Mother        recurrent kidney stones--unclear what type   Heart disease Mother        Ischemic Heart Disease   Pulmonary embolism Mother        unknown clotting disorder.  Patient states she was tested for same and did not have.   Diabetes Father    Heart disease Father        MI   Stroke Father    Asthma Sister    Hypertension Sister    Cancer Brother        Primary lung, but met to nasopharyngeal area.    SOCIAL HISTORY: Social History   Socioeconomic History   Marital status: Legally Separated    Spouse name: Not on file   Number of children: Not on file   Years of education: Not on file   Highest education level: Not on file  Occupational History   Occupation: cma at [employer]   Tobacco Use   Smoking status: Former    Types: Cigarettes    Quit date: 07/08/2017    Years since  quitting: 4.4   Smokeless tobacco: Never  Vaping Use   Vaping Use: Never used  Substance and Sexual Activity   Alcohol use: Yes    Comment: socially   Drug use: No   Sexual activity: Yes    Birth control/protection: Surgical  Other Topics Concern   Not on file  Social History Narrative   Not on file   Social Determinants of Health   Financial Resource Strain: Not on file  Food Insecurity: Not on file  Transportation Needs: Not on file  Physical Activity: Not on file  Stress: Not on file  Social Connections: Not on file  Intimate Partner Violence: Not on file    PHYSICAL EXAM  GENERAL EXAM/CONSTITUTIONAL: Vitals:  Vitals:   12/12/21 0821  BP: 109/75  Pulse: 86  Weight: 177 lb 8 oz (80.5 kg)  Height: 5' 4.6" (1.641 m)   Body mass index is 29.9 kg/m. Wt Readings from Last 3 Encounters:  12/12/21 177 lb 8 oz (80.5 kg)  12/10/21 178 lb (80.7 kg)  01/31/21 182 lb 8 oz (82.8 kg)   Patient is in no distress; well developed, nourished and groomed; neck is supple  EYES: Pupils round and reactive to light, Visual fields full to confrontation, Extraocular movements intacts,   MUSCULOSKELETAL: Gait, strength, tone, movements noted in Neurologic exam below  NEUROLOGIC: MENTAL STATUS:      No data to display         awake, alert, oriented to person, place and time recent and remote memory intact normal attention and concentration language fluent, comprehension intact, naming intact fund of knowledge appropriate  CRANIAL NERVE:  2nd - no papilledema or hemorrhages on fundoscopic exam 2nd, 3rd, 4th, 6th - pupils equal and reactive to light, visual fields full to confrontation, extraocular muscles intact, no nystagmus 5th - facial sensation symmetric 7th - facial strength symmetric 8th - hearing intact 9th - palate elevates symmetrically, uvula midline 11th - shoulder shrug symmetric 12th - tongue protrusion midline  MOTOR:  normal bulk and tone, full  strength in the BUE, BLE  SENSORY:  normal and symmetric to light touch, pinprick  COORDINATION:  finger-nose-finger, fine finger movements normal  REFLEXES:  deep tendon reflexes present and symmetric  GAIT/STATION:  normal  DIAGNOSTIC DATA (LABS, IMAGING, TESTING) - I reviewed patient records, labs, notes, testing and imaging myself where available.  Lab Results  Component Value Date   WBC 9.1 12/10/2021   HGB 12.8 12/10/2021   HCT 39.3 12/10/2021   MCV 79.6 (L) 12/10/2021   PLT 291 12/10/2021      Component Value Date/Time   NA 137 12/10/2021 1904   NA 144 08/08/2020 1534   K 4.1 12/10/2021 1904   CL 104 12/10/2021 1904   CO2 24 12/10/2021 1904   GLUCOSE 97 12/10/2021 1904   BUN 18 12/10/2021 1904   BUN 19 08/08/2020 1534   CREATININE 0.91 12/10/2021 1904   CALCIUM 9.6 12/10/2021 1904   PROT 6.5 01/31/2021 1434   PROT 6.6 08/08/2020 1534   ALBUMIN 3.7 01/31/2021 1434   ALBUMIN 4.1 08/08/2020 1534   AST 13 (L) 01/31/2021 1434   ALT 10 01/31/2021 1434   ALKPHOS 49 01/31/2021 1434   BILITOT 0.4 01/31/2021 1434   BILITOT <0.2 08/08/2020 1534   GFRNONAA >60 12/10/2021 1904   GFRAA >60 11/08/2018 0914   Lab Results  Component Value Date   CHOL 178 08/08/2020   HDL 57 08/08/2020   LDLCALC 100 (H) 08/08/2020   TRIG 116 08/08/2020   CHOLHDL 3.1 08/08/2020   Lab Results  Component Value Date   HGBA1C 5.5 08/08/2020   No results found for: "VITAMINB12" Lab Results  Component Value Date   TSH 1.220 08/08/2020    MRI Brain 12/10/21 Normal brain MRI. There is also evidence of chiari malformation (My personal addition)    ASSESSMENT AND PLAN  41 y.o. year old female with history of headaches and Chiari malformation who is presenting with new onset of headache for the past month.  Headaches located on top of the head and  the back, described as pressure and throbbing headaches.  She reports headache with sexual activity, also reports headaches with emotional  lability, when having an argument, there is also headache with Valsalva.  Review of the MRI shows evidence of Chiari malformation.  I have explained to the patient that her headaches are likely from the Chiari malformation causing irritation of the meninges.  At this time, I am going to give her a trial of gabapentin, recommend physical therapy for cervicogenic headache, massages and acupuncture but I will also refer her to neurosurgery for consultation regarding surgical options.  She is comfortable with plan.  Advised her to contact me if the gabapentin is not helpful.  Continue follow-up PCP and return in 3 months or sooner if worse.   1. Chiari malformation type I ([place])   2. Cervicogenic headache   3. Headache associated with sexual activity   4. Other complicated headache syndrome     Patient Instructions  Refer to nurse neurosurgery for surgical option Trial of gabapentin 300 mg twice daily Physical therapy for cervicogenic headache Recommended massages and acupuncture Follow-up in 3 months or sooner if worse   Orders Placed This Encounter  Procedures   Ambulatory referral to Physical Therapy   Ambulatory referral to Neurosurgery    Meds ordered this encounter  Medications   gabapentin (NEURONTIN) 300 MG capsule    Sig: Take 1 capsule (300 mg total) by mouth 2 (two) times daily.    Dispense:  60 capsule    Refill:  0    Return in about 3 months (around 03/14/2022).  I have spent a total of 60 minutes dedicated to this patient today, preparing to see patient, performing a medically appropriate examination and evaluation, ordering tests and/or medications and procedures, and counseling and educating the patient/family/caregiver; independently interpreting result and communicating results to the family/patient/caregiver; and documenting clinical information in the electronic medical record.   Alric Ran, MD 12/12/2021, 10:12 AM  [facility] [address], [place], [place] [address] [phone]

## 2021-12-19 ENCOUNTER — Encounter: Payer: Self-pay | Admitting: Physical Therapy

## 2021-12-19 ENCOUNTER — Ambulatory Visit: Payer: Managed Care, Other (non HMO) | Attending: Neurology | Admitting: Physical Therapy

## 2021-12-19 DIAGNOSIS — M542 Cervicalgia: Secondary | ICD-10-CM | POA: Diagnosis present

## 2021-12-19 DIAGNOSIS — M5412 Radiculopathy, cervical region: Secondary | ICD-10-CM | POA: Diagnosis present

## 2021-12-19 DIAGNOSIS — G4486 Cervicogenic headache: Secondary | ICD-10-CM | POA: Insufficient documentation

## 2021-12-19 NOTE — Therapy (Signed)
OUTPATIENT PHYSICAL THERAPY CERVICAL EVALUATION   Patient Name: Kylie Rivas MRN: [id number] DOB:08/27/1980, 41 y.o., female Today's Date: 12/19/2021   PT End of Session - 12/19/21 1535     Visit Number 1    Number of Visits 5   with eval   Date for PT Re-Evaluation 01/30/22   to allow for scheduling conflicts   Authorization Type [employer]    PT Start Time 1535    PT Stop Time 1610   evaluation   PT Time Calculation (min) 35 min    Activity Tolerance Patient tolerated treatment well;Patient limited by pain    Behavior During Therapy [facility] for tasks assessed/performed             Past Medical History:  Diagnosis Date   Allergy    Bee stings, shellfish   Anemia    Asthma    winter time.  Last episode 2017   Family history of adverse reaction to anesthesia    Patient states her mother had a hard time waking up from anesthesia   GERD (gastroesophageal reflux disease)    Headache    Migraines   Heart murmur    Childhood   Stomach ulcer    Past Surgical History:  Procedure Laterality Date   ANKLE SURGERY Right 2003   ligament repair   ANKLE SURGERY Left 2009   LIgament repair   CYSTOSCOPY  02/07/2021   Procedure: CYSTOSCOPY;  Surgeon: Crawford Givens, MD;  Location: [place];  Service: Gynecology;;   HERNIA REPAIR  [id number]   umbilical   LAPAROSCOPIC VAGINAL HYSTERECTOMY WITH SALPINGECTOMY Bilateral 02/07/2021   Procedure: LAPAROSCOPIC ASSISTED VAGINAL HYSTERECTOMY WITH BILATERAL SALPINGECTOMY;  Surgeon: Crawford Givens, MD;  Location: [place];  Service: Gynecology;  Laterality: Bilateral;   TUBAL LIGATION  2010   Patient Active Problem List   Diagnosis Date Noted   Menorrhagia 02/07/2021   S/P laparoscopic assisted vaginal hysterectomy (LAVH) 02/07/2021   Dysfunctional uterine bleeding 01/31/2021   Asthma    Allergy     PCP: Minette Brine, FNP  REFERRING PROVIDER: Alric Ran, MD   REFERRING DIAG: G44.86 (ICD-10-CM) - Cervicogenic headache   THERAPY  DIAG:  Cervicalgia  Radiculopathy, cervical region  Rationale for Evaluation and Treatment Rehabilitation  ONSET DATE: 12/12/2021   SUBJECTIVE:                                                                                                                                                                                                         SUBJECTIVE STATEMENT: Pt reports she has been having headaches  and neck pain for the past month. Pt also reports being aware that she has a chiari malformation and plans to meet with neurosurgery next week to discuss options for decompression. Pt reports she has been having a friend "crack" her neck and her back which gave her short-term relief and was initially going to seek out a chiropractor until the neurologist suggested she try physical therapy. "If I bend over I get a headache and it lasts for a while. I used to be able to clean the house in one weekend day now takes 2 if not more" "I feel I have wasted a lot of time because of these headaches"  PERTINENT HISTORY:  None reported  PAIN:  Are you having pain? Yes: NPRS scale: not rated/10 Pain location: headache, neck, upper shoulders/back Pain description: tension, irritating Aggravating factors: stress, bending over, housework Relieving factors: maxing out on ibuprofen, time/rest   PRECAUTIONS: Other: chiari malformation  WEIGHT BEARING RESTRICTIONS No  FALLS:  Has patient fallen in last 6 months? No  LIVING ENVIRONMENT: Lives with: lives with their family Lives in: House/apartment Stairs: Yes: External: 3-4 steps; can reach both Has following equipment at home: None  OCCUPATION: certified medical assistant; on feet all day long, uses a computer a lot  PLOF: Independent  PATIENT GOALS "to be able to function with minimal pain as possible"  OBJECTIVE:   DIAGNOSTIC FINDINGS:  Head CT 12/10/21 IMPRESSION: 1. No acute intracranial abnormality. 2. Partially empty sella,  typically incidental but can be seen with idiopathic intracranial hypertension.  PATIENT SURVEYS:  NDI 17/50  COGNITION: Overall cognitive status: Within functional limits for tasks assessed  SENSATION: Pt reports radicular symptoms into BUE (R>L) including N/T in her fingers  POSTURE: rounded shoulders and forward head  PALPATION: Palpable muscle tightness in suboccipitals and B UT, trigger point in L UT   CERVICAL ROM:   Active ROM AROM (deg) eval  Flexion 30 pain at C7  Extension 30 pain at C7  Right lateral flexion 40 painful  Left lateral flexion 20 painful (more so than R)  Right rotation 45  Left rotation 60   (Blank rows = not tested)  UPPER EXTREMITY ROM:  Active ROM Right eval Left eval  Shoulder flexion WFL but pain mid back [facility] but pain mid back  Shoulder extension    Shoulder abduction WFL with pain in shoudlers and posterior neck WFL with pain in shoulders and posterior neck  Shoulder adduction    Shoulder extension    Shoulder internal rotation    Shoulder external rotation    Elbow flexion    Elbow extension    Wrist flexion    Wrist extension    Wrist ulnar deviation    Wrist radial deviation    Wrist pronation    Wrist supination     (Blank rows = not tested)  UPPER EXTREMITY MMT:  MMT Right eval Left eval  Shoulder flexion 5 5  Shoulder extension    Shoulder abduction 5 5  Shoulder adduction    Shoulder extension    Shoulder internal rotation    Shoulder external rotation    Middle trapezius    Lower trapezius    Elbow flexion    Elbow extension    Wrist flexion    Wrist extension    Wrist ulnar deviation    Wrist radial deviation    Wrist pronation    Wrist supination    Grip strength     (Blank rows = not  tested)   TODAY'S TREATMENT:  Supine suboccipital release x 5 min with some radiating pain down into L shoulder that resolves once manual therapy terminated  Initiated HEP: see bolded below   PATIENT EDUCATION:   Education details: Eval findings, PT POC, initiated HEP Person educated: Patient Education method: Explanation, Demonstration, and Handouts Education comprehension: verbalized understanding and returned demonstration   HOME EXERCISE PROGRAM: Access Code: [email or web address] URL: [email or web address] Date: 12/19/2021 Prepared by: Peter Congo  Exercises - Seated Upper Trapezius Stretch  - 1 x daily - 7 x weekly - 1 sets - 5 reps - 30 sec hold - Supine Thoracic Mobilization Towel Roll Vertical with Arm Stretch  - 1 x daily - 7 x weekly - 1 sets - 10 reps  ASSESSMENT:  CLINICAL IMPRESSION: Patient is a 41 year old female referred to Neuro OPPT for cervicogenic headaches.   Pt's PMH is significant for: none on record. The following deficits were present during the exam: decreased cervical ROM, increased disability as evidenced by score of 17/50 on NDI, increased pain and muscle tightness. Pt would benefit from skilled PT to address these impairments and functional limitations to maximize functional mobility independence.   OBJECTIVE IMPAIRMENTS decreased activity tolerance, decreased knowledge of condition, decreased mobility, decreased ROM, increased fascial restrictions, impaired perceived functional ability, impaired flexibility, impaired sensation, impaired UE functional use, improper body mechanics, postural dysfunction, and pain.   ACTIVITY LIMITATIONS carrying, lifting, bending, and reach over head  PARTICIPATION LIMITATIONS: cleaning, driving, and occupation  PERSONAL FACTORS  No relevant factors at this time  are also affecting patient's functional outcome.   REHAB POTENTIAL: Good  CLINICAL DECISION MAKING: Stable/uncomplicated  EVALUATION COMPLEXITY: Low   GOALS: Goals reviewed with patient? Yes  SHORT TERM GOALS=LONG TERM GOALS due to length of POC   LONG TERM GOALS: Target date: 01/31/2022  Pt will be independent with final HEP for improved strength,  balance, transfers and gait. Baseline: initiated at eval 10/18 Goal status: INITIAL  2.  Pt will improve her score on NDI to 12/50 to demonstrate decreased disability level Baseline: 17/50 Goal status: INITIAL  3.  Pt will increase her cervical extension ROM to 40 degrees to demonstrate improved functional mobility Baseline: 30 deg Goal status: INITIAL  4.  Pt will increase her L lateral cervical rotation to 30 degrees to demonstrate improved functional mobility Baseline: 20 deg Goal status: INITIAL  5.  Pt will increase her R cervical rotation to 55 degrees to demonstrate improved functional mobility Baseline: 45 deg Goal status: INITIAL   PLAN: PT FREQUENCY: 1x/week  PT DURATION: 6 weeks (5 total visits, to allow for scheduling conflicts, per pt request due to financial burden of PT visits)  PLANNED INTERVENTIONS: Therapeutic exercises, Therapeutic activity, Neuromuscular re-education, Balance training, Gait training, Patient/Family education, Self Care, Joint mobilization, Joint manipulation, Vestibular training, Canalith repositioning, Visual/preceptual remediation/compensation, DME instructions, Dry Needling, Electrical stimulation, Spinal manipulation, Spinal mobilization, Cryotherapy, Moist heat, Taping, Traction, Manual therapy, and Re-evaluation  PLAN FOR NEXT SESSION: dry needling of UT (no suboccipitals), add to HEP for cervical and upper back/shoulder stretches, postural stability, manual therapy   Peter Congo, PT, DPT, CSRS 12/19/2021, 4:11 PM

## 2021-12-28 ENCOUNTER — Ambulatory Visit: Payer: Managed Care, Other (non HMO) | Admitting: Physical Therapy

## 2021-12-28 DIAGNOSIS — M542 Cervicalgia: Secondary | ICD-10-CM

## 2021-12-28 DIAGNOSIS — M5412 Radiculopathy, cervical region: Secondary | ICD-10-CM

## 2021-12-28 NOTE — Therapy (Signed)
OUTPATIENT PHYSICAL THERAPY [place]   Patient Name: Kylie Rivas MRN: [id number] DOB:19-Dec-1980, 41 y.o., female Today's Date: 12/28/2021   PT End of Session - 12/28/21 1454     Visit Number 2    Number of Visits 5   with eval   Date for PT Re-Evaluation 01/30/22   to allow for scheduling conflicts   Authorization Type [employer]    PT Start Time 1449   ran over with previous patient   PT Stop Time 1527    PT Time Calculation (min) 38 min    Activity Tolerance Patient tolerated treatment well    Behavior During Therapy [facility] for tasks assessed/performed              Past Medical History:  Diagnosis Date   Allergy    Bee stings, shellfish   Anemia    Asthma    winter time.  Last episode 2017   Family history of adverse reaction to anesthesia    Patient states her mother had a hard time waking up from anesthesia   GERD (gastroesophageal reflux disease)    Headache    Migraines   Heart murmur    Childhood   Stomach ulcer    Past Surgical History:  Procedure Laterality Date   ANKLE SURGERY Right 2003   ligament repair   ANKLE SURGERY Left 2009   LIgament repair   CYSTOSCOPY  02/07/2021   Procedure: CYSTOSCOPY;  Surgeon: Crawford Givens, MD;  Location: [place];  Service: Gynecology;;   HERNIA REPAIR  [id number]   umbilical   LAPAROSCOPIC VAGINAL HYSTERECTOMY WITH SALPINGECTOMY Bilateral 02/07/2021   Procedure: LAPAROSCOPIC ASSISTED VAGINAL HYSTERECTOMY WITH BILATERAL SALPINGECTOMY;  Surgeon: Crawford Givens, MD;  Location: [place];  Service: Gynecology;  Laterality: Bilateral;   TUBAL LIGATION  2010   Patient Active Problem List   Diagnosis Date Noted   Menorrhagia 02/07/2021   S/P laparoscopic assisted vaginal hysterectomy (LAVH) 02/07/2021   Dysfunctional uterine bleeding 01/31/2021   Asthma    Allergy     PCP: Minette Brine, FNP  REFERRING PROVIDER: Alric Ran, MD   REFERRING DIAG: G44.86 (ICD-10-CM) - Cervicogenic headache   THERAPY DIAG:   Cervicalgia  Radiculopathy, cervical region  Rationale for Evaluation and Treatment Rehabilitation  ONSET DATE: 12/12/2021   SUBJECTIVE:                                                                                                                                                                                                         SUBJECTIVE STATEMENT: Pt reports she was sore after  the first session, has been doing her HEP, goal is 2x/day but often only has time to do it 1x/day. Pt reports she also had a massage this week which helped with her pain. Pt reports that stress makes her pain worse and this week at work has been stressful. Pt also reports she met with neurosurgery this week, won't be scheduled until January for her surgery for Chiari malformation.  PERTINENT HISTORY:  None reported  PAIN:  Are you having pain? Yes: NPRS scale: 4/10 Pain location: headache, neck, upper shoulders/back Pain description: tension, irritating Aggravating factors: stress, bending over, housework Relieving factors: maxing out on ibuprofen, time/rest   PRECAUTIONS: Other: chiari malformation  WEIGHT BEARING RESTRICTIONS No  FALLS:  Has patient fallen in last 6 months? No  LIVING ENVIRONMENT: Lives with: lives with their family (3 kids-21,18,15) Lives in: House/apartment Stairs: Yes: External: 3-4 steps; can reach both Has following equipment at home: None  OCCUPATION: certified medical assistant; on feet all day long, uses a computer a lot  PLOF: [place] "to be able to function with minimal pain as possible"  OBJECTIVE:  All objective measures obtained at evaluation unless otherwise noted  CERVICAL ROM:   Active ROM AROM (deg) eval  Flexion 30 pain at C7  Extension 30 pain at C7  Right lateral flexion 40 painful  Left lateral flexion 20 painful (more so than R)  Right rotation 45  Left rotation 60   (Blank rows = not tested)    TODAY'S TREATMENT:   Supine suboccipital release 5 x 45 sec with no radiation in pain this date with pt reported relief of "pressure" Supine cervical traction 3 x 60 sec each with pt reported relief of "pressure" Supine cervical lateral flexion L and R 4 x 30 sec each, pt reports she feels a stretch in UT with exercise but no real increase in pain  Added to HEP: see bolded below   PATIENT EDUCATION:  Education details: added to HEP Person educated: Patient Education method: Consulting civil engineer, Media planner, and Handouts Education comprehension: verbalized understanding and returned demonstration   HOME EXERCISE PROGRAM: Access Code: [email or web address] URL: [email or web address] Date: 12/19/2021 Prepared by: Excell Seltzer  Exercises - Seated Upper Trapezius Stretch  - 1 x daily - 7 x weekly - 1 sets - 5 reps - 30 sec hold - Supine Thoracic Mobilization Towel Roll Vertical with Arm Stretch  - 1 x daily - 7 x weekly - 1 sets - 10 reps - Supine Suboccipital Release with Tennis Balls  - 1 x daily - 7 x weekly - 1 sets - 1 reps - 5 min hold - Supine Cervical Retraction with Towel  - 1 x daily - 7 x weekly - 2 sets - 10 reps - Child's Pose Stretch  - 1-2 x daily - 7 x weekly - 1 sets - 5 reps - 30-60 sec hold - Child's Pose with Sidebending  - 1-2 x daily - 7 x weekly - 1 sets - 5 reps - 30-60 sec hold - Child's Pose with Thread the Needle  - 1-2 x daily - 7 x weekly - 1 sets - 5 reps - 30-60 sec hold  ASSESSMENT:  CLINICAL IMPRESSION: Emphasis of skilled PT session on performing manual therapy for release of suboccipital muscles, cervical traction, and lateral cervical flexion as well as adding to HEP for manual therapy, stretches, and exercises that can be performed at home. Pt will improvement in symptoms this date as compared to initial evaluation.  Pt continues to benefit from skilled therapy services to address ongoing pain and decreased function. Continue POC.    OBJECTIVE IMPAIRMENTS decreased  activity tolerance, decreased knowledge of condition, decreased mobility, decreased ROM, increased fascial restrictions, impaired perceived functional ability, impaired flexibility, impaired sensation, impaired UE functional use, improper body mechanics, postural dysfunction, and pain.   ACTIVITY LIMITATIONS carrying, lifting, bending, and reach over head  PARTICIPATION LIMITATIONS: cleaning, driving, and occupation  PERSONAL FACTORS  No relevant factors at this time  are also affecting patient's functional outcome.   REHAB POTENTIAL: Good  CLINICAL DECISION MAKING: Stable/uncomplicated  EVALUATION COMPLEXITY: Low   GOALS: Goals reviewed with patient? Yes  SHORT TERM GOALS=LONG TERM GOALS due to length of POC   LONG TERM GOALS: Target date: 01/31/2022  Pt will be independent with final HEP for improved strength, balance, transfers and gait. Baseline: initiated at eval 10/18 Goal status: INITIAL  2.  Pt will improve her score on NDI to 12/50 to demonstrate decreased disability level Baseline: 17/50 Goal status: INITIAL  3.  Pt will increase her cervical extension ROM to 40 degrees to demonstrate improved functional mobility Baseline: 30 deg Goal status: INITIAL  4.  Pt will increase her L lateral cervical rotation to 30 degrees to demonstrate improved functional mobility Baseline: 20 deg Goal status: INITIAL  5.  Pt will increase her R cervical rotation to 55 degrees to demonstrate improved functional mobility Baseline: 45 deg Goal status: INITIAL   PLAN: PT FREQUENCY: 1x/week  PT DURATION: 6 weeks (5 total visits, to allow for scheduling conflicts, per pt request due to financial burden of PT visits)  PLANNED INTERVENTIONS: Therapeutic exercises, Therapeutic activity, Neuromuscular re-education, Balance training, Gait training, Patient/Family education, Self Care, Joint mobilization, Joint manipulation, Vestibular training, Canalith repositioning, Visual/preceptual  remediation/compensation, DME instructions, Dry Needling, Electrical stimulation, Spinal manipulation, Spinal mobilization, Cryotherapy, Moist heat, Taping, Traction, Manual therapy, and Re-evaluation  PLAN FOR NEXT SESSION: dry needling of UT (no suboccipitals), add to HEP for cervical and upper back/shoulder stretches, postural stability, manual therapy, cat/cow, scap squeezes   Excell Seltzer, PT, DPT, CSRS 12/28/2021, 3:28 PM

## 2022-01-11 ENCOUNTER — Ambulatory Visit: Payer: Managed Care, Other (non HMO) | Attending: Neurology | Admitting: Physical Therapy

## 2022-01-11 DIAGNOSIS — M542 Cervicalgia: Secondary | ICD-10-CM | POA: Diagnosis present

## 2022-01-11 DIAGNOSIS — M5412 Radiculopathy, cervical region: Secondary | ICD-10-CM | POA: Insufficient documentation

## 2022-01-11 NOTE — Therapy (Signed)
OUTPATIENT PHYSICAL THERAPY CERVICAL TREATMENT   Patient Name: Kylie Rivas MRN: [id number] DOB:April 05, 1980, 41 y.o., female Today's Date: 01/11/2022   PT End of Session - 01/11/22 1453     Visit Number 3    Number of Visits 5   with eval   Date for PT Re-Evaluation 01/30/22   to allow for scheduling conflicts   Authorization Type [employer]    PT Start Time 1450   pt late   PT Stop Time 1525    PT Time Calculation (min) 35 min    Activity Tolerance Patient tolerated treatment well    Behavior During Therapy [facility] for tasks assessed/performed               Past Medical History:  Diagnosis Date   Allergy    Bee stings, shellfish   Anemia    Asthma    winter time.  Last episode 2017   Family history of adverse reaction to anesthesia    Patient states her mother had a hard time waking up from anesthesia   GERD (gastroesophageal reflux disease)    Headache    Migraines   Heart murmur    Childhood   Stomach ulcer    Past Surgical History:  Procedure Laterality Date   ANKLE SURGERY Right 2003   ligament repair   ANKLE SURGERY Left 2009   LIgament repair   CYSTOSCOPY  02/07/2021   Procedure: CYSTOSCOPY;  Surgeon: Jaymes Graff, MD;  Location: [facility] OR;  Service: Gynecology;;   HERNIA REPAIR  2010   umbilical   LAPAROSCOPIC VAGINAL HYSTERECTOMY WITH SALPINGECTOMY Bilateral 02/07/2021   Procedure: LAPAROSCOPIC ASSISTED VAGINAL HYSTERECTOMY WITH BILATERAL SALPINGECTOMY;  Surgeon: Jaymes Graff, MD;  Location: [facility] OR;  Service: Gynecology;  Laterality: Bilateral;   TUBAL LIGATION  2010   Patient Active Problem List   Diagnosis Date Noted   Menorrhagia 02/07/2021   S/P laparoscopic assisted vaginal hysterectomy (LAVH) 02/07/2021   Dysfunctional uterine bleeding 01/31/2021   Asthma    Allergy     PCP: Arnette Felts, FNP  REFERRING PROVIDER: Windell Norfolk, MD   REFERRING DIAG: G44.86 (ICD-10-CM) - Cervicogenic headache   THERAPY DIAG:   Cervicalgia  Radiculopathy, cervical region  Rationale for Evaluation and Treatment Rehabilitation  ONSET DATE: 12/12/2021   SUBJECTIVE:                                                                                                                                                                                                         SUBJECTIVE STATEMENT: Pt reports she did a lot of  driving over the past week and this flared up her pain/soreness. Pt reports 5/10 pain in her neck today, was 8/10 when she was driving. Pt reports she did feel like the cervical retraction exercise was helpful. Pt reports heat has also been helpful.  PERTINENT HISTORY:  None reported  PAIN:  Are you having pain? Yes: NPRS scale: 5/10 Pain location: headache, neck, upper shoulders/back Pain description: tension, irritating Aggravating factors: stress, bending over, housework, DRIVING Relieving factors: maxing out on ibuprofen, time/rest   PRECAUTIONS: Other: chiari malformation  WEIGHT BEARING RESTRICTIONS No  FALLS:  Has patient fallen in last 6 months? No  LIVING ENVIRONMENT: Lives with: lives with their family (3 kids-21,18,15) Lives in: House/apartment Stairs: Yes: External: 3-4 steps; can reach both Has following equipment at home: None  OCCUPATION: certified medical assistant; on feet all day long, uses a computer a lot  PLOF: Independent  PATIENT GOALS "to be able to function with minimal pain as possible"  OBJECTIVE:  All objective measures obtained at evaluation unless otherwise noted  CERVICAL ROM:   Active ROM AROM (deg) eval  Flexion 30 pain at C7  Extension 30 pain at C7  Right lateral flexion 40 painful  Left lateral flexion 20 painful (more so than R)  Right rotation 45  Left rotation 60   (Blank rows = not tested)    TODAY'S TREATMENT:    MANUAL THERAPY: Supine suboccipital release 5 x 45 sec with no radiation in pain this date with pt reported relief of  "pressure" Supine cervical traction 3 x 60 sec each with pt reported relief of "pressure" Supine cervical lateral flexion L and R 4 x 30 sec each, pt reports she feels a stretch in UT with exercise but no real increase in pain  THER ACT:  Trigger Point Dry-Needling  Treatment instructions: Expect mild to moderate muscle soreness. S/S of pneumothorax if dry needled over a lung field, and to seek immediate medical attention should they occur. Patient verbalized understanding of these instructions and education.  Patient Consent Given: Yes Education handout provided: Yes Muscles treated: R and L UT Treatment response/outcome: muscle twitch detected, decreased tightness detected following treatment   Trigger Point Dry Needling  What is Trigger Point Dry Needling (DN)? DN is a physical therapy technique used to treat muscle pain and dysfunction. Specifically, DN helps deactivate muscle trigger points (muscle knots).  A thin filiform needle is used to penetrate the skin and stimulate the underlying trigger point. The goal is for a local twitch response (LTR) to occur and for the trigger point to relax. No medication of any kind is injected during the procedure.   What Does Trigger Point Dry Needling Feel Like?  The procedure feels different for each individual patient. Some patients report that they do not actually feel the needle enter the skin and overall the process is not painful. Very mild bleeding may occur. However, many patients feel a deep cramping in the muscle in which the needle was inserted. This is the local twitch response.   How Will I feel after the treatment? Soreness is normal, and the onset of soreness may not occur for a few hours. Typically this soreness does not last longer than two days.  Bruising is uncommon, however; ice can be used to decrease any possible bruising.  In rare cases feeling tired or nauseous after the treatment is normal. In addition, your symptoms may  get worse before they get better, this period will typically not last longer  than 24 hours.   What Can I do After My Treatment? Increase your hydration by drinking more water for the next 24 hours. You may place ice or heat on the areas treated that have become sore, however, do not use heat on inflamed or bruised areas. Heat often brings more relief post needling. You can continue your regular activities, but vigorous activity is not recommended initially after the treatment for 24 hours. DN is best combined with other physical therapy such as strengthening, stretching, and other therapies.     PATIENT EDUCATION:  Education details: continue HEP, TPDN (see above) Person educated: Patient Education method: Explanation, Demonstration, and Handouts Education comprehension: verbalized understanding and returned demonstration   HOME EXERCISE PROGRAM: Access Code: [email or web address] URL: [email or web address] Date: 12/19/2021 Prepared by: Peter Congo  Exercises - Seated Upper Trapezius Stretch  - 1 x daily - 7 x weekly - 1 sets - 5 reps - 30 sec hold - Supine Thoracic Mobilization Towel Roll Vertical with Arm Stretch  - 1 x daily - 7 x weekly - 1 sets - 10 reps - Supine Suboccipital Release with Tennis Balls  - 1 x daily - 7 x weekly - 1 sets - 1 reps - 5 min hold - Supine Cervical Retraction with Towel  - 1 x daily - 7 x weekly - 2 sets - 10 reps - Child's Pose Stretch  - 1-2 x daily - 7 x weekly - 1 sets - 5 reps - 30-60 sec hold - Child's Pose with Sidebending  - 1-2 x daily - 7 x weekly - 1 sets - 5 reps - 30-60 sec hold - Child's Pose with Thread the Needle  - 1-2 x daily - 7 x weekly - 1 sets - 5 reps - 30-60 sec hold  ASSESSMENT:  CLINICAL IMPRESSION: Emphasis of skilled PT session on performing manual therapy and TPDN for pain relief this date. Also reviewed HEP exercises that target the pain/soreness pt is experiencing today including UT stretch, suboccipital release,  and cervical retraction. Pt continues to benefit from skilled therapy services to address ongoing cervicalgia. Continue POC.    OBJECTIVE IMPAIRMENTS decreased activity tolerance, decreased knowledge of condition, decreased mobility, decreased ROM, increased fascial restrictions, impaired perceived functional ability, impaired flexibility, impaired sensation, impaired UE functional use, improper body mechanics, postural dysfunction, and pain.   ACTIVITY LIMITATIONS carrying, lifting, bending, and reach over head  PARTICIPATION LIMITATIONS: cleaning, driving, and occupation  PERSONAL FACTORS  No relevant factors at this time  are also affecting patient's functional outcome.   REHAB POTENTIAL: Good  CLINICAL DECISION MAKING: Stable/uncomplicated  EVALUATION COMPLEXITY: Low   GOALS: Goals reviewed with patient? Yes  SHORT TERM GOALS=LONG TERM GOALS due to length of POC   LONG TERM GOALS: Target date: 01/31/2022  Pt will be independent with final HEP for improved strength, balance, transfers and gait. Baseline: initiated at eval 10/18 Goal status: INITIAL  2.  Pt will improve her score on NDI to 12/50 to demonstrate decreased disability level Baseline: 17/50 Goal status: INITIAL  3.  Pt will increase her cervical extension ROM to 40 degrees to demonstrate improved functional mobility Baseline: 30 deg Goal status: INITIAL  4.  Pt will increase her L lateral cervical rotation to 30 degrees to demonstrate improved functional mobility Baseline: 20 deg Goal status: INITIAL  5.  Pt will increase her R cervical rotation to 55 degrees to demonstrate improved functional mobility Baseline: 45 deg Goal status: INITIAL   PLAN: PT FREQUENCY: 1x/week  PT DURATION: 6 weeks (5 total visits, to allow for scheduling conflicts, per pt request due to financial burden of PT visits)  PLANNED INTERVENTIONS: Therapeutic exercises, Therapeutic activity, Neuromuscular re-education, Balance  training, Gait training, Patient/Family education, Self Care, Joint mobilization, Joint manipulation, Vestibular training, Canalith repositioning, Visual/preceptual remediation/compensation, DME instructions, Dry Needling, Electrical stimulation, Spinal manipulation, Spinal mobilization, Cryotherapy, Moist heat, Taping, Traction, Manual therapy, and Re-evaluation  PLAN FOR NEXT SESSION: dry needling of UT (no suboccipitals), add to HEP for cervical and upper back/shoulder stretches, postural stability, manual therapy, cat/cow, scap squeezes   Peter Congo, PT, DPT, CSRS 01/11/2022, 3:26 PM

## 2022-01-18 ENCOUNTER — Ambulatory Visit: Payer: Managed Care, Other (non HMO) | Admitting: Physical Therapy

## 2022-01-28 ENCOUNTER — Other Ambulatory Visit: Payer: Self-pay | Admitting: Neurological Surgery

## 2022-02-01 ENCOUNTER — Ambulatory Visit: Payer: Managed Care, Other (non HMO) | Admitting: Physical Therapy

## 2022-03-05 NOTE — Progress Notes (Signed)
Surgical Instructions    Your procedure is scheduled on Monday January 8th.  Report to [facility] Main Entrance "A" at 9:15 A.M., then check in with the Admitting office.  Call this number if you have problems the morning of surgery:  [phone]   If you have any questions prior to your surgery date call [phone]: Open Monday-Friday 8am-4pm If you experience any cold or flu symptoms such as cough, fever, chills, shortness of breath, etc. between now and your scheduled surgery, please notify us at the above number     Remember:  Do not eat after midnight the night before your surgery  You may drink clear liquids until 8:15am the morning of your surgery.   Clear liquids allowed are: Water, Non-Citrus Juices (without pulp), Carbonated Beverages, Clear Tea, Black Coffee ONLY (NO MILK, CREAM OR POWDERED CREAMER of any kind), and Gatorade    Take these medicines the morning of surgery with A SIP OF WATER: famotidine (PEPCID) 20 MG tablet    As of today, STOP taking any Aspirin (unless otherwise instructed by your surgeon) Aleve, Naproxen, Ibuprofen, Motrin, Advil, Goody's, BC's, all herbal medications, fish oil, and all vitamins.           Do not wear jewelry or makeup. Do not wear lotions, powders, perfumes or deodorant. Do not shave 48 hours prior to surgery.  Do not bring valuables to the hospital. Do not wear nail polish, gel polish, artificial nails, or any other type of covering on natural nails (fingers and toes) If you have artificial nails or gel coating that need to be removed by a nail salon, please have this removed prior to surgery. Artificial nails or gel coating may interfere with anesthesia's ability to adequately monitor your vital signs.  [facility] is not responsible for any belongings or valuables.    Do NOT Smoke (Tobacco/Vaping)  24 hours prior to your procedure  If you use a CPAP at night, you may bring your mask for your overnight stay.   Contacts,  glasses, hearing aids, dentures or partials may not be worn into surgery, please bring cases for these belongings   For patients admitted to the hospital, discharge time will be determined by your treatment team.   Patients discharged the day of surgery will not be allowed to drive home, and someone needs to stay with them for 24 hours.   SURGICAL WAITING ROOM VISITATION Patients having surgery or a procedure may have no more than 2 support people in the waiting area - these visitors may rotate.   Children under the age of 80 must have an adult with them who is not the patient. If the patient needs to stay at the hospital during part of their recovery, the visitor guidelines for inpatient rooms apply. Pre-op nurse will coordinate an appropriate time for 1 support person to accompany patient in pre-op.  This support person may not rotate.   Please refer to [email or web address] for the visitor guidelines for Inpatients (after your surgery is over and you are in a regular room).    Special instructions:    Oral Hygiene is also important to reduce your risk of infection.  Remember - BRUSH YOUR TEETH THE MORNING OF SURGERY WITH YOUR REGULAR TOOTHPASTE   [facility]- Preparing For Surgery  Before surgery, you can play an important role. Because skin is not sterile, your skin needs to be as free of germs as possible. You can reduce the number of germs on your skin by  washing with CHG (chlorahexidine gluconate) Soap before surgery.  CHG is an antiseptic cleaner which kills germs and bonds with the skin to continue killing germs even after washing.     Please do not use if you have an allergy to CHG or antibacterial soaps. If your skin becomes reddened/irritated stop using the CHG.  Do not shave (including legs and underarms) for at least 48 hours prior to first CHG shower. It is OK to shave your face.  Please follow these instructions  carefully.     Shower the NIGHT BEFORE SURGERY and the MORNING OF SURGERY with CHG Soap.   If you chose to wash your hair, wash your hair first as usual with your normal shampoo. After you shampoo, rinse your hair and body thoroughly to remove the shampoo.  Then [employer] and genitals (private parts) with your normal soap and rinse thoroughly to remove soap.  After that Use CHG Soap as you would any other liquid soap. You can apply CHG directly to the skin and wash gently with a scrungie or a clean washcloth.   Apply the CHG Soap to your body ONLY FROM THE NECK DOWN.  Do not use on open wounds or open sores. Avoid contact with your eyes, ears, mouth and genitals (private parts). Wash Face and genitals (private parts)  with your normal soap.   Wash thoroughly, paying special attention to the area where your surgery will be performed.  Thoroughly rinse your body with warm water from the neck down.  DO NOT shower/wash with your normal soap after using and rinsing off the CHG Soap.  Pat yourself dry with a CLEAN TOWEL.  Wear CLEAN PAJAMAS to bed the night before surgery  Place CLEAN SHEETS on your bed the night before your surgery  DO NOT SLEEP WITH PETS.   Day of Surgery:  Take a shower with CHG soap. Wear Clean/Comfortable clothing the morning of surgery Do not apply any deodorants/lotions.   Remember to brush your teeth WITH YOUR REGULAR TOOTHPASTE.    If you received a COVID test during your pre-op visit, it is requested that you wear a mask when out in public, stay away from anyone that may not be feeling well, and notify your surgeon if you develop symptoms. If you have been in contact with anyone that has tested positive in the last 10 days, please notify your surgeon.    Please read over the following fact sheets that you were given.

## 2022-03-06 ENCOUNTER — Encounter (HOSPITAL_COMMUNITY)
Admission: RE | Admit: 2022-03-06 | Discharge: 2022-03-06 | Disposition: A | Discharge status: Home / Self Care | Payer: Managed Care, Other (non HMO) | Source: Ambulatory Visit | Attending: Neurological Surgery | Admitting: Neurological Surgery

## 2022-03-06 ENCOUNTER — Other Ambulatory Visit: Payer: Self-pay

## 2022-03-06 ENCOUNTER — Encounter (HOSPITAL_COMMUNITY): Payer: Self-pay

## 2022-03-06 VITALS — BP 125/75 | HR 76 | Temp 98.3°F | Resp 18 | Ht 64.0 in | Wt 183.5 lb

## 2022-03-06 DIAGNOSIS — Z01818 Encounter for other preprocedural examination: Secondary | ICD-10-CM

## 2022-03-06 DIAGNOSIS — Z01812 Encounter for preprocedural laboratory examination: Secondary | ICD-10-CM | POA: Insufficient documentation

## 2022-03-06 LAB — TYPE AND SCREEN
ABO/RH(D): AB POS
Antibody Screen: NEGATIVE

## 2022-03-06 LAB — CBC
HCT: 38.5 % (ref 36.0–46.0)
Hemoglobin: 12.2 g/dL (ref 12.0–15.0)
MCH: 26.1 pg (ref 26.0–34.0)
MCHC: 31.7 g/dL (ref 30.0–36.0)
MCV: 82.3 fL (ref 80.0–100.0)
Platelets: 347 10*3/uL (ref 150–400)
RBC: 4.68 MIL/uL (ref 3.87–5.11)
RDW: 13 % (ref 11.5–15.5)
WBC: 7.3 10*3/uL (ref 4.0–10.5)
nRBC: 0 % (ref 0.0–0.2)

## 2022-03-06 LAB — SURGICAL PCR SCREEN
MRSA, PCR: NEGATIVE
Staphylococcus aureus: NEGATIVE

## 2022-03-06 NOTE — Progress Notes (Signed)
PCP - Minette Brine FNP Cardiologist - denies  PPM/ICD - denies Device Orders -  Rep Notified -   Chest x-ray - na EKG - na Stress Test - none ECHO - none Cardiac Cath -none   Sleep Study - none CPAP -   Fasting Blood Sugar - na Checks Blood Sugar _____ times a day  Last dose of GLP1 agonist-  na GLP1 instructions:   Blood Thinner Instructions:na Aspirin Instructions:na  ERAS Protcol -clear liquids until 0815 PRE-SURGERY Ensure or G2- no  COVID TEST- na   Anesthesia review:no   Patient denies shortness of breath, fever, cough and chest pain at PAT appointment   All instructions explained to the patient, with a verbal understanding of the material. Patient agrees to go over the instructions while at home for a better understanding. Patient also instructed to wear a mask when out in public prior to surgery. The opportunity to ask questions was provided.

## 2022-03-11 ENCOUNTER — Inpatient Hospital Stay (HOSPITAL_COMMUNITY)
Admission: RE | Admit: 2022-03-11 | Discharge: 2022-03-13 | DRG: 027 | Disposition: A | Discharge status: Home / Self Care | Payer: Managed Care, Other (non HMO) | Source: Ambulatory Visit | Attending: Neurological Surgery | Admitting: Neurological Surgery

## 2022-03-11 ENCOUNTER — Encounter (HOSPITAL_COMMUNITY): Payer: Self-pay | Admitting: Neurological Surgery

## 2022-03-11 ENCOUNTER — Other Ambulatory Visit (HOSPITAL_COMMUNITY): Payer: Self-pay

## 2022-03-11 ENCOUNTER — Inpatient Hospital Stay (HOSPITAL_COMMUNITY): Payer: Managed Care, Other (non HMO) | Admitting: Certified Registered Nurse Anesthetist

## 2022-03-11 ENCOUNTER — Inpatient Hospital Stay (HOSPITAL_COMMUNITY): Payer: Managed Care, Other (non HMO) | Admitting: Physician Assistant

## 2022-03-11 ENCOUNTER — Encounter (HOSPITAL_COMMUNITY)
Admission: RE | Disposition: A | Discharge status: Home / Self Care | Payer: Self-pay | Source: Ambulatory Visit | Attending: Neurological Surgery

## 2022-03-11 ENCOUNTER — Other Ambulatory Visit: Payer: Self-pay

## 2022-03-11 DIAGNOSIS — Z841 Family history of disorders of kidney and ureter: Secondary | ICD-10-CM

## 2022-03-11 DIAGNOSIS — G935 Compression of brain: Secondary | ICD-10-CM | POA: Diagnosis present

## 2022-03-11 DIAGNOSIS — Z8249 Family history of ischemic heart disease and other diseases of the circulatory system: Secondary | ICD-10-CM | POA: Diagnosis not present

## 2022-03-11 DIAGNOSIS — G43909 Migraine, unspecified, not intractable, without status migrainosus: Secondary | ICD-10-CM | POA: Diagnosis present

## 2022-03-11 DIAGNOSIS — J45909 Unspecified asthma, uncomplicated: Secondary | ICD-10-CM | POA: Diagnosis present

## 2022-03-11 DIAGNOSIS — Z87891 Personal history of nicotine dependence: Secondary | ICD-10-CM | POA: Diagnosis not present

## 2022-03-11 DIAGNOSIS — Z823 Family history of stroke: Secondary | ICD-10-CM

## 2022-03-11 DIAGNOSIS — K219 Gastro-esophageal reflux disease without esophagitis: Secondary | ICD-10-CM | POA: Diagnosis present

## 2022-03-11 DIAGNOSIS — Z825 Family history of asthma and other chronic lower respiratory diseases: Secondary | ICD-10-CM

## 2022-03-11 DIAGNOSIS — Z801 Family history of malignant neoplasm of trachea, bronchus and lung: Secondary | ICD-10-CM

## 2022-03-11 DIAGNOSIS — Z833 Family history of diabetes mellitus: Secondary | ICD-10-CM

## 2022-03-11 HISTORY — PX: SUBOCCIPITAL CRANIECTOMY CERVICAL LAMINECTOMY: SHX5404

## 2022-03-11 LAB — CBC
HCT: 36 % (ref 36.0–46.0)
Hemoglobin: 12.1 g/dL (ref 12.0–15.0)
MCH: 26.4 pg (ref 26.0–34.0)
MCHC: 33.6 g/dL (ref 30.0–36.0)
MCV: 78.6 fL — ABNORMAL LOW (ref 80.0–100.0)
Platelets: 326 10*3/uL (ref 150–400)
RBC: 4.58 MIL/uL (ref 3.87–5.11)
RDW: 12.8 % (ref 11.5–15.5)
WBC: 13 10*3/uL — ABNORMAL HIGH (ref 4.0–10.5)
nRBC: 0 % (ref 0.0–0.2)

## 2022-03-11 LAB — CREATININE, SERUM
Creatinine, Ser: 0.77 mg/dL (ref 0.44–1.00)
GFR, Estimated: >60 mL/min (ref >60)

## 2022-03-11 SURGERY — SUBOCCIPITAL CRANIECTOMY CERVICAL LAMINECTOMY/DURAPLASTY
Anesthesia: General

## 2022-03-11 MED ORDER — PROPOFOL 10 MG/ML IV BOLUS
INTRAVENOUS | Status: DC | PRN
  Administered 2022-03-11: 60 mg via INTRAVENOUS
  Administered 2022-03-11: 90 mg via INTRAVENOUS
  Administered 2022-03-11: 200 mg via INTRAVENOUS

## 2022-03-11 MED ORDER — SODIUM CHLORIDE 0.9 % IV SOLN
0.1500 ug/kg/min | INTRAVENOUS | Status: AC
  Administered 2022-03-11: .2 ug/kg/min via INTRAVENOUS
  Filled 2022-03-11: qty 2000

## 2022-03-11 MED ORDER — LIDOCAINE 2% (20 MG/ML) 5 ML SYRINGE
INTRAMUSCULAR | Status: DC | PRN
  Administered 2022-03-11: 80 mg via INTRAVENOUS

## 2022-03-11 MED ORDER — DEXAMETHASONE SODIUM PHOSPHATE 10 MG/ML IJ SOLN
INTRAMUSCULAR | Status: DC | PRN
  Administered 2022-03-11: 10 mg via INTRAVENOUS

## 2022-03-11 MED ORDER — LIDOCAINE-EPINEPHRINE 2 %-1:100000 IJ SOLN
INTRAMUSCULAR | Status: AC
  Filled 2022-03-11: qty 1

## 2022-03-11 MED ORDER — THROMBIN 20000 UNITS EX SOLR
CUTANEOUS | Status: AC
  Filled 2022-03-11: qty 20000

## 2022-03-11 MED ORDER — MIDAZOLAM HCL 2 MG/2ML IJ SOLN
INTRAMUSCULAR | Status: AC
  Filled 2022-03-11: qty 2

## 2022-03-11 MED ORDER — FENTANYL CITRATE (PF) 100 MCG/2ML IJ SOLN
25.0000 ug | INTRAMUSCULAR | Status: DC | PRN
  Administered 2022-03-11: 25 ug via INTRAVENOUS

## 2022-03-11 MED ORDER — HEPARIN SODIUM (PORCINE) 5000 UNIT/ML IJ SOLN
5000.0000 [IU] | Freq: Three times a day (TID) | INTRAMUSCULAR | Status: DC
  Administered 2022-03-13: 5000 [IU] via SUBCUTANEOUS
  Filled 2022-03-11: qty 1

## 2022-03-11 MED ORDER — LABETALOL HCL 5 MG/ML IV SOLN
10.0000 mg | INTRAVENOUS | Status: DC | PRN
  Administered 2022-03-11: 20 mg via INTRAVENOUS
  Administered 2022-03-11: 10 mg via INTRAVENOUS
  Filled 2022-03-11: qty 8
  Filled 2022-03-11: qty 4

## 2022-03-11 MED ORDER — CHLORHEXIDINE GLUCONATE 0.12 % MT SOLN
15.0000 mL | Freq: Once | OROMUCOSAL | Status: AC
  Administered 2022-03-11: 15 mL via OROMUCOSAL
  Filled 2022-03-11: qty 15

## 2022-03-11 MED ORDER — ONDANSETRON HCL 4 MG PO TABS: 4.0000 mg | ORAL_TABLET | ORAL | Status: DC | PRN

## 2022-03-11 MED ORDER — HEMOSTATIC AGENTS (NO CHARGE) OPTIME
TOPICAL | Status: DC | PRN
  Administered 2022-03-11: 1 via TOPICAL

## 2022-03-11 MED ORDER — PROPOFOL 10 MG/ML IV BOLUS
INTRAVENOUS | Status: AC
  Filled 2022-03-11: qty 20

## 2022-03-11 MED ORDER — BACITRACIN ZINC 500 UNIT/GM EX OINT
TOPICAL_OINTMENT | CUTANEOUS | Status: DC | PRN
  Administered 2022-03-11: 1 via TOPICAL

## 2022-03-11 MED ORDER — THROMBIN 5000 UNITS EX SOLR
OROMUCOSAL | Status: DC | PRN
  Administered 2022-03-11: 5 mL via TOPICAL

## 2022-03-11 MED ORDER — THROMBIN 5000 UNITS EX SOLR
CUTANEOUS | Status: AC
  Filled 2022-03-11: qty 5000

## 2022-03-11 MED ORDER — CEFAZOLIN SODIUM-DEXTROSE 2-4 GM/100ML-% IV SOLN
2.0000 g | Freq: Three times a day (TID) | INTRAVENOUS | Status: AC
  Administered 2022-03-11 – 2022-03-12 (×2): 2 g via INTRAVENOUS
  Filled 2022-03-11 (×2): qty 100

## 2022-03-11 MED ORDER — PROMETHAZINE HCL 25 MG PO TABS
12.5000 mg | ORAL_TABLET | ORAL | Status: DC | PRN
  Administered 2022-03-11 – 2022-03-12 (×3): 25 mg via ORAL
  Filled 2022-03-11 (×4): qty 1

## 2022-03-11 MED ORDER — AMISULPRIDE (ANTIEMETIC) 5 MG/2ML IV SOLN: 10.0000 mg | Freq: Once | INTRAVENOUS | Status: AC

## 2022-03-11 MED ORDER — FENTANYL CITRATE (PF) 250 MCG/5ML IJ SOLN
INTRAMUSCULAR | Status: AC
  Filled 2022-03-11: qty 5

## 2022-03-11 MED ORDER — FENTANYL CITRATE (PF) 100 MCG/2ML IJ SOLN
INTRAMUSCULAR | Status: AC
  Filled 2022-03-11: qty 2

## 2022-03-11 MED ORDER — BUPIVACAINE HCL (PF) 0.5 % IJ SOLN
INTRAMUSCULAR | Status: AC
  Filled 2022-03-11: qty 30

## 2022-03-11 MED ORDER — ACETAMINOPHEN 325 MG PO TABS: 650.0000 mg | ORAL_TABLET | ORAL | Status: DC | PRN

## 2022-03-11 MED ORDER — 0.9 % SODIUM CHLORIDE (POUR BTL) OPTIME
TOPICAL | Status: DC | PRN
  Administered 2022-03-11: 2000 mL

## 2022-03-11 MED ORDER — OXYCODONE HCL 5 MG/5ML PO SOLN: 5.0000 mg | Freq: Once | ORAL | Status: DC | PRN

## 2022-03-11 MED ORDER — THROMBIN 20000 UNITS EX SOLR
CUTANEOUS | Status: DC | PRN
  Administered 2022-03-11: 20 mL via TOPICAL

## 2022-03-11 MED ORDER — POLYETHYLENE GLYCOL 3350 17 G PO PACK: 17.0000 g | PACK | Freq: Every day | ORAL | Status: DC | PRN

## 2022-03-11 MED ORDER — CHLORHEXIDINE GLUCONATE CLOTH 2 % EX PADS: 6.0000 | MEDICATED_PAD | Freq: Once | CUTANEOUS | Status: DC

## 2022-03-11 MED ORDER — BACITRACIN ZINC 500 UNIT/GM EX OINT
TOPICAL_OINTMENT | CUTANEOUS | Status: AC
  Filled 2022-03-11: qty 28.35

## 2022-03-11 MED ORDER — CEFAZOLIN SODIUM-DEXTROSE 2-4 GM/100ML-% IV SOLN
2.0000 g | INTRAVENOUS | Status: AC
  Administered 2022-03-11: 2 g via INTRAVENOUS
  Filled 2022-03-11: qty 100

## 2022-03-11 MED ORDER — AMISULPRIDE (ANTIEMETIC) 5 MG/2ML IV SOLN
INTRAVENOUS | Status: AC
  Administered 2022-03-11: 10 mg via INTRAVENOUS
  Filled 2022-03-11: qty 4

## 2022-03-11 MED ORDER — CHLORHEXIDINE GLUCONATE CLOTH 2 % EX PADS: 6.0000 | MEDICATED_PAD | Freq: Every day | CUTANEOUS | Status: DC

## 2022-03-11 MED ORDER — ONDANSETRON HCL 4 MG/2ML IJ SOLN: 4.0000 mg | Freq: Four times a day (QID) | INTRAMUSCULAR | Status: DC | PRN

## 2022-03-11 MED ORDER — HYDROCODONE-ACETAMINOPHEN 5-325 MG PO TABS
1.0000 | ORAL_TABLET | ORAL | Status: DC | PRN
  Filled 2022-03-11: qty 1

## 2022-03-11 MED ORDER — ESMOLOL HCL 100 MG/10ML IV SOLN
INTRAVENOUS | Status: AC
  Filled 2022-03-11: qty 10

## 2022-03-11 MED ORDER — ORAL CARE MOUTH RINSE: 15.0000 mL | Freq: Once | OROMUCOSAL | Status: AC

## 2022-03-11 MED ORDER — FAMOTIDINE IN NACL 20-0.9 MG/50ML-% IV SOLN: 20.0000 mg | Freq: Two times a day (BID) | INTRAVENOUS | Status: DC

## 2022-03-11 MED ORDER — LIDOCAINE-EPINEPHRINE 1 %-1:100000 IJ SOLN
INTRAMUSCULAR | Status: DC | PRN
  Administered 2022-03-11: 10 mL

## 2022-03-11 MED ORDER — ESMOLOL HCL 100 MG/10ML IV SOLN
INTRAVENOUS | Status: DC | PRN
  Administered 2022-03-11: 20 mg via INTRAVENOUS
  Administered 2022-03-11 (×2): 30 mg via INTRAVENOUS
  Administered 2022-03-11: 20 mg via INTRAVENOUS

## 2022-03-11 MED ORDER — MUPIROCIN 2 % EX OINT
TOPICAL_OINTMENT | CUTANEOUS | Status: AC
  Filled 2022-03-11: qty 22

## 2022-03-11 MED ORDER — FAMOTIDINE 20 MG PO TABS
20.0000 mg | ORAL_TABLET | Freq: Two times a day (BID) | ORAL | Status: DC
  Administered 2022-03-11 – 2022-03-12 (×2): 20 mg via ORAL
  Filled 2022-03-11 (×2): qty 1

## 2022-03-11 MED ORDER — PHENYLEPHRINE HCL-NACL 20-0.9 MG/250ML-% IV SOLN
INTRAVENOUS | Status: DC | PRN
  Administered 2022-03-11: 25 ug/min via INTRAVENOUS

## 2022-03-11 MED ORDER — OXYCODONE HCL 5 MG PO TABS: 5.0000 mg | ORAL_TABLET | Freq: Once | ORAL | Status: DC | PRN

## 2022-03-11 MED ORDER — ROCURONIUM BROMIDE 10 MG/ML (PF) SYRINGE
PREFILLED_SYRINGE | INTRAVENOUS | Status: AC
  Filled 2022-03-11: qty 10

## 2022-03-11 MED ORDER — LACTATED RINGERS IV SOLN: INTRAVENOUS | Status: DC

## 2022-03-11 MED ORDER — FENTANYL CITRATE (PF) 250 MCG/5ML IJ SOLN
INTRAMUSCULAR | Status: DC | PRN
  Administered 2022-03-11: 150 ug via INTRAVENOUS
  Administered 2022-03-11: 50 ug via INTRAVENOUS

## 2022-03-11 MED ORDER — SODIUM CHLORIDE 0.9 % IV SOLN: INTRAVENOUS | Status: DC

## 2022-03-11 MED ORDER — SODIUM CHLORIDE 0.9 % IV SOLN: INTRAVENOUS | Status: DC | PRN

## 2022-03-11 MED ORDER — HYDROMORPHONE HCL 1 MG/ML IJ SOLN
0.5000 mg | INTRAMUSCULAR | Status: DC | PRN
  Administered 2022-03-11 – 2022-03-12 (×4): 0.5 mg via INTRAVENOUS
  Filled 2022-03-11 (×4): qty 1

## 2022-03-11 MED ORDER — ONDANSETRON HCL 4 MG/2ML IJ SOLN
4.0000 mg | INTRAMUSCULAR | Status: DC | PRN
  Filled 2022-03-11: qty 2

## 2022-03-11 MED ORDER — ACETAMINOPHEN 650 MG RE SUPP: 650.0000 mg | RECTAL | Status: DC | PRN

## 2022-03-11 MED ORDER — ONDANSETRON HCL 4 MG/2ML IJ SOLN
INTRAMUSCULAR | Status: DC | PRN
  Administered 2022-03-11: 4 mg via INTRAVENOUS

## 2022-03-11 MED ORDER — ROCURONIUM BROMIDE 10 MG/ML (PF) SYRINGE
PREFILLED_SYRINGE | INTRAVENOUS | Status: DC | PRN
  Administered 2022-03-11: 60 mg via INTRAVENOUS
  Administered 2022-03-11: 20 mg via INTRAVENOUS

## 2022-03-11 MED ORDER — DOCUSATE SODIUM 100 MG PO CAPS
100.0000 mg | ORAL_CAPSULE | Freq: Two times a day (BID) | ORAL | Status: DC
  Administered 2022-03-11 – 2022-03-12 (×2): 100 mg via ORAL
  Filled 2022-03-11 (×2): qty 1

## 2022-03-11 MED ORDER — KETOROLAC TROMETHAMINE 30 MG/ML IJ SOLN
15.0000 mg | Freq: Four times a day (QID) | INTRAMUSCULAR | Status: DC | PRN
  Administered 2022-03-11 – 2022-03-12 (×2): 15 mg via INTRAVENOUS
  Filled 2022-03-11 (×2): qty 1

## 2022-03-11 SURGICAL SUPPLY — 65 items
ADH SKN CLS APL DERMABOND .7 (GAUZE/BANDAGES/DRESSINGS) ×1
APL PRP STRL LF DISP 70% ISPRP (MISCELLANEOUS) ×1
APL SKNCLS STERI-STRIP NONHPOA (GAUZE/BANDAGES/DRESSINGS)
BAG COUNTER SPONGE SURGICOUNT (BAG) ×2 IMPLANT
BAG SPNG CNTER NS LX DISP (BAG) ×2
BAND INSRT 18 STRL LF DISP RB (MISCELLANEOUS) ×2
BAND RUBBER #18 3X1/16 STRL (MISCELLANEOUS) IMPLANT
BENZOIN TINCTURE PRP APPL 2/3 (GAUZE/BANDAGES/DRESSINGS) IMPLANT
BLADE CLIPPER SURG (BLADE) ×4 IMPLANT
BLADE ULTRA TIP 2M (BLADE) IMPLANT
BUR ACORN 9.0 PRECISION (BURR) ×2 IMPLANT
BUR PRECISION FLUTE 5.0 (BURR) IMPLANT
CANISTER SUCT 3000ML PPV (MISCELLANEOUS) ×2 IMPLANT
CHLORAPREP W/TINT 26 (MISCELLANEOUS) IMPLANT
CLIP VESOCCLUDE MED 6/CT (CLIP) IMPLANT
COVER BACK TABLE 60X90IN (DRAPES) IMPLANT
DERMABOND ADVANCED .7 DNX12 (GAUZE/BANDAGES/DRESSINGS) ×2 IMPLANT
DRAPE INCISE 23X17 IOBAN STRL (DRAPES) ×1
DRAPE INCISE 23X17 STRL (DRAPES) IMPLANT
DRAPE INCISE IOBAN 23X17 STRL (DRAPES) ×1 IMPLANT
DRAPE LAPAROTOMY 100X72 PEDS (DRAPES) ×2 IMPLANT
DRAPE MICROSCOPE SLANT 54X150 (MISCELLANEOUS) IMPLANT
DRAPE WARM FLUID 44X44 (DRAPES) ×2 IMPLANT
DURAGUARD 04CMX04CM IMPLANT
DURAPREP 6ML APPLICATOR 50/CS (WOUND CARE) ×2 IMPLANT
ELECT REM PT RETURN 9FT ADLT (ELECTROSURGICAL) ×1
ELECTRODE REM PT RTRN 9FT ADLT (ELECTROSURGICAL) ×2 IMPLANT
EVACUATOR 1/8 PVC DRAIN (DRAIN) IMPLANT
EVACUATOR SILICONE 100CC (DRAIN) IMPLANT
GAUZE 4X4 16PLY ~~LOC~~+RFID DBL (SPONGE) IMPLANT
GAUZE SPONGE 4X4 12PLY STRL (GAUZE/BANDAGES/DRESSINGS) IMPLANT
GLOVE BIOGEL PI IND STRL 7.5 (GLOVE) ×2 IMPLANT
GLOVE ECLIPSE 7.5 STRL STRAW (GLOVE) ×2 IMPLANT
GOWN STRL REUS W/ TWL LRG LVL3 (GOWN DISPOSABLE) ×2 IMPLANT
GOWN STRL REUS W/ TWL XL LVL3 (GOWN DISPOSABLE) IMPLANT
GOWN STRL REUS W/TWL 2XL LVL3 (GOWN DISPOSABLE) IMPLANT
GOWN STRL REUS W/TWL LRG LVL3 (GOWN DISPOSABLE) ×1
GOWN STRL REUS W/TWL XL LVL3 (GOWN DISPOSABLE)
HEMOSTAT POWDER KIT SURGIFOAM (HEMOSTASIS) ×2 IMPLANT
HEMOSTAT SURGICEL 2X14 (HEMOSTASIS) IMPLANT
KIT BASIN OR (CUSTOM PROCEDURE TRAY) ×2 IMPLANT
KIT TURNOVER KIT B (KITS) ×2 IMPLANT
NDL HYPO 25X1 1.5 SAFETY (NEEDLE) ×2 IMPLANT
NEEDLE HYPO 25X1 1.5 SAFETY (NEEDLE) ×1 IMPLANT
NS IRRIG 1000ML POUR BTL (IV SOLUTION) ×2 IMPLANT
PACK CRANIOTOMY CUSTOM (CUSTOM PROCEDURE TRAY) ×2 IMPLANT
PAD ARMBOARD 7.5X6 YLW CONV (MISCELLANEOUS) ×6 IMPLANT
PATTIES SURGICAL .5 X1 (DISPOSABLE) IMPLANT
PATTIES SURGICAL 1/4 X 3 (GAUZE/BANDAGES/DRESSINGS) IMPLANT
SEALANT ADHERUS EXTEND TIP (MISCELLANEOUS) IMPLANT
SPONGE SURGIFOAM ABS GEL 100 (HEMOSTASIS) IMPLANT
SPONGE T-LAP 4X18 ~~LOC~~+RFID (SPONGE) IMPLANT
STAPLER SKIN PROX WIDE 3.9 (STAPLE) IMPLANT
SUT ETHILON 3 0 FSL (SUTURE) IMPLANT
SUT MNCRL AB 3-0 PS2 27 (SUTURE) ×2 IMPLANT
SUT NURALON 4 0 TR CR/8 (SUTURE) IMPLANT
SUT PROLENE 6 0 BV (SUTURE) ×4 IMPLANT
SUT VIC AB 0 CT1 18XCR BRD8 (SUTURE) ×2 IMPLANT
SUT VIC AB 0 CT1 8-18 (SUTURE) ×1
SUT VIC AB 2-0 CP2 18 (SUTURE) ×2 IMPLANT
TOWEL GREEN STERILE (TOWEL DISPOSABLE) ×2 IMPLANT
TOWEL GREEN STERILE FF (TOWEL DISPOSABLE) IMPLANT
TRAY FOLEY MTR SLVR 16FR STAT (SET/KITS/TRAYS/PACK) IMPLANT
UNDERPAD 30X36 HEAVY ABSORB (UNDERPADS AND DIAPERS) IMPLANT
WATER STERILE IRR 1000ML POUR (IV SOLUTION) ×2 IMPLANT

## 2022-03-11 NOTE — Anesthesia Procedure Notes (Signed)
Arterial Line Insertion Start/End1/10/2022 10:25 AM, 03/11/2022 10:35 AM Performed by: Thelma Comp, CRNA  Patient location: Pre-op. Preanesthetic checklist: patient identified, IV checked, site marked, risks and benefits discussed, surgical consent, monitors and equipment checked, pre-op evaluation, timeout performed and anesthesia consent Lidocaine 1% used for infiltration Left, radial was placed Catheter size: 20 G Hand hygiene performed  and maximum sterile barriers used   Attempts: 1 Procedure performed without using ultrasound guided technique. Following insertion, dressing applied and Biopatch. Post procedure assessment: normal and unchanged  Patient tolerated the procedure well with no immediate complications. Additional procedure comments: Donnie Mesa SRNA placed a-line under CRNA supervision.

## 2022-03-11 NOTE — Anesthesia Procedure Notes (Signed)
Procedure Name: Intubation Date/Time: 03/11/2022 12:40 PM  Performed by: Carolan Clines, CRNAPre-anesthesia Checklist: Patient identified, Emergency Drugs available, Suction available and Patient being monitored Patient Re-evaluated:Patient Re-evaluated prior to induction Oxygen Delivery Method: Circle System Utilized Preoxygenation: Pre-oxygenation with 100% oxygen Induction Type: IV induction Ventilation: Mask ventilation without difficulty Laryngoscope Size: Mac and 4 Grade View: Grade II Tube type: Oral Tube size: 7.0 mm Number of attempts: 1 Airway Equipment and Method: Stylet and Oral airway Placement Confirmation: ETT inserted through vocal cords under direct vision, positive ETCO2 and breath sounds checked- equal and bilateral Secured at: 21 cm Tube secured with: Tape Dental Injury: Teeth and Oropharynx as per pre-operative assessment  Comments: Donnie Mesa SRNA intubated under direct supervision of CRNA

## 2022-03-11 NOTE — Anesthesia Preprocedure Evaluation (Signed)
Anesthesia Evaluation  Patient identified by MRN, date of birth, ID band Patient awake    Reviewed: Allergy & Precautions, H&P , NPO status , Patient's Chart, lab work & pertinent test results  Airway Mallampati: II   Neck ROM: full    Dental   Pulmonary asthma , former smoker   breath sounds clear to auscultation       Cardiovascular negative cardio ROS  Rhythm:regular Rate:Normal     Neuro/Psych  Headaches Chiari malformation    GI/Hepatic PUD,GERD  ,,  Endo/Other    Renal/GU      Musculoskeletal   Abdominal   Peds  Hematology  (+) Blood dyscrasia, anemia   Anesthesia Other Findings   Reproductive/Obstetrics                             Anesthesia Physical Anesthesia Plan  ASA: 2  Anesthesia Plan: General   Post-op Pain Management:    Induction: Intravenous  PONV Risk Score and Plan: 3 and Ondansetron, Dexamethasone, Midazolam and Treatment may vary due to age or medical condition  Airway Management Planned: Oral ETT  Additional Equipment: Arterial line  Intra-op Plan:   Post-operative Plan: Extubation in OR  Informed Consent: I have reviewed the patients History and Physical, chart, labs and discussed the procedure including the risks, benefits and alternatives for the proposed anesthesia with the patient or authorized representative who has indicated his/her understanding and acceptance.     Dental advisory given  Plan Discussed with: CRNA, Anesthesiologist and Surgeon  Anesthesia Plan Comments:        Anesthesia Quick Evaluation

## 2022-03-11 NOTE — Transfer of Care (Signed)
Immediate Anesthesia Transfer of Care Note  Patient: Kylie Rivas  Procedure(s) Performed: Suboccipital craniotomy and Cervical One laminectomy for chiari malformation  Patient Location: PACU  Anesthesia Type:General  Level of Consciousness: awake  Airway & Oxygen Therapy: Patient Spontanous Breathing  Post-op Assessment: Report given to RN  Post vital signs: Reviewed and stable  Last Vitals:  Vitals Value Taken Time  BP 153/96 03/11/22 1500  Temp    Pulse 77 03/11/22 1508  Resp 11 03/11/22 1508  SpO2 99 % 03/11/22 1508  Vitals shown include unvalidated device data.  Last Pain:  Vitals:   03/11/22 0958  PainSc: 4       Patients Stated Pain Goal: 2 (26/94/85 4627)  Complications: No notable events documented.

## 2022-03-11 NOTE — H&P (Signed)
Surgical H&P Update  HPI: 42 y.o. with a history of chiari malformation, previously diagnosed, she deferred surgery until recently when her symptoms worsened. No changes in health since they were last seen. Still having severe headaches and wishes to proceed with surgery.  PMHx:  Past Medical History:  Diagnosis Date   Allergy    Bee stings, shellfish   Anemia    Asthma    winter time.  Last episode 2017   Family history of adverse reaction to anesthesia    Patient states her mother had a hard time waking up from anesthesia   GERD (gastroesophageal reflux disease)    Headache    Migraines   Heart murmur    Childhood   Stomach ulcer    FamHx:  Family History  Problem Relation Age of Onset   Diabetes Mother    Hypertension Mother    Seizures Mother        due to strokes in past   Stroke Mother    Kidney disease Mother        recurrent kidney stones--unclear what type   Heart disease Mother        Ischemic Heart Disease   Pulmonary embolism Mother        unknown clotting disorder.  Patient states she was tested for same and did not have.   Diabetes Father    Heart disease Father        MI   Stroke Father    Asthma Sister    Hypertension Sister    Cancer Brother        Primary lung, but met to nasopharyngeal area.   SocHx:  reports that she quit smoking about 4 years ago. Her smoking use included cigarettes. She has never used smokeless tobacco. She reports current alcohol use. She reports that she does not use drugs.  Physical Exam: Strength 5/5 x4 and SILTx4   Assesment/Plan: 42 y.o. woman with chiari malformation, here for chiari decompression. Risks, benefits, and alternatives discussed and the patient would like to continue with surgery.  -OR today -4N ICU post-op  [name], MD 03/11/22 11:41 AM

## 2022-03-11 NOTE — Op Note (Signed)
PATIENT: Kylie Rivas  DAY OF SURGERY: 03/11/22   PRE-OPERATIVE DIAGNOSIS:  Chiari malformation   POST-OPERATIVE DIAGNOSIS:  Same   PROCEDURE:  Suboccipital craniectomy and C1 laminectomy   SURGEON:  Surgeon(s) and Role:    [name], MD - Primary    Norm Parcel PA - Assisting   ANESTHESIA: ETGA   BRIEF HISTORY: This is a 42 year old woman who presented with headaches typical of a Chiari malformation with poor headache control. I therefore recommended Chiari decompression. This was discussed with the patient as well as risks, benefits, and alternatives and wished to proceed with surgery.   OPERATIVE DETAIL: The patient was taken to the operating room, anesthesia was induced by the anesthesia team, [place] head holder was placed, and the patient was placed on the OR table in the prone position. A formal time out was performed with two patient identifiers and confirmed the operative site. The operative site was marked, hair was clipped with surgical clippers, the area was then prepped and draped in a sterile fashion.   A linear incision was placed in the midline from just beneath the inion to just above the spinous process of C2. Soft tissues were dissected, retractors palced, and a subottipical craniectomy was performed along with a C1 laminectomy using a combination of high speed drill and rongeurs. The dura was opened and an expansile duroplasty was performed by sewing in a piece of duraguard. This was watertight to valsalva, Adheris was placed to bolster the suture line, and hemostasis was confirmed.   The wound was copiously irrigated, all instrument and sponge counts were correct, and the incision was then closed in layers. The patient was then returned to anesthesia for emergence. No apparent complications at the completion of the procedure.   EBL:  281mL   DRAINS: none   SPECIMENS: none   [name], MD 03/11/22 11:46 AM

## 2022-03-12 LAB — RENAL FUNCTION PANEL
Albumin: 3.3 g/dL — ABNORMAL LOW (ref 3.5–5.0)
Anion gap: 9 (ref 5–15)
BUN: 13 mg/dL (ref 6–20)
CO2: 23 mmol/L (ref 22–32)
Calcium: 8.4 mg/dL — ABNORMAL LOW (ref 8.9–10.3)
Chloride: 103 mmol/L (ref 98–111)
Creatinine, Ser: 0.6 mg/dL (ref 0.44–1.00)
GFR, Estimated: >60 mL/min (ref >60)
Glucose, Bld: 121 mg/dL — ABNORMAL HIGH (ref 70–99)
Phosphorus: 2.8 mg/dL (ref 2.5–4.6)
Potassium: 3.2 mmol/L — ABNORMAL LOW (ref 3.5–5.1)
Sodium: 135 mmol/L (ref 135–145)

## 2022-03-12 MED ORDER — SODIUM CHLORIDE 0.9 % IV SOLN: INTRAVENOUS | Status: DC | PRN

## 2022-03-12 MED ORDER — SODIUM CHLORIDE 0.9 % IV SOLN
12.5000 mg | Freq: Four times a day (QID) | INTRAVENOUS | Status: DC | PRN
  Administered 2022-03-12: 12.5 mg via INTRAVENOUS
  Filled 2022-03-12 (×2): qty 0.5

## 2022-03-12 NOTE — Evaluation (Signed)
Physical Therapy Evaluation Patient Details Name: Kylie Rivas MRN: [id number] DOB: 1980-11-16 Today's Date: 03/12/2022  History of Present Illness  Pt is a 42yo female s/p crani for chiari malformation decompression. PMH: gerd, anemia  Clinical Impression  Pt admitted with above. Pt mobility limited today by neck pain and +emesis however very motivated to mobilize. Suspect pt will progress well once pain and n/v under control. Despite +emesis pt min guard for transfer to chair. Pt with good home set up and support. Discussed letting her family do all household chores until she goes to follow up appt with MD. Acute PT to cont to follow.       Recommendations for follow up therapy are one component of a multi-disciplinary discharge planning process, led by the attending physician.  Recommendations may be updated based on patient status, additional functional criteria and insurance authorization.  Follow Up Recommendations No PT follow up      Assistance Recommended at Discharge Intermittent Supervision/Assistance  Patient can return home with the following  A little help with walking and/or transfers;A little help with bathing/dressing/bathroom;Assistance with cooking/housework    Equipment Recommendations None recommended by PT  Recommendations for Other Services       Functional Status Assessment Patient has had a recent decline in their functional status and demonstrates the ability to make significant improvements in function in a reasonable and predictable amount of time.     Precautions / Restrictions Precautions Precautions: Fall;Other (comment) Precaution Comments: pt nauseated with +emesis Restrictions Weight Bearing Restrictions: No      Mobility  Bed Mobility Overal bed mobility: Needs Assistance Bed Mobility: Supine to Sit     Supine to sit: Min guard     General bed mobility comments: HOB elevated, guarded due to neck pain, pulled self to long  sit and brought self to EOB, encouraged log roll to help minimize strain on neck, no physical assist needed    Transfers Overall transfer level: Needs assistance Equipment used: 1 person hand held assist Transfers: Sit to/from Stand, Bed to chair/wheelchair/BSC Sit to Stand: Min assist   Step pivot transfers: Min assist       General transfer comment: pt guard due to nausea but steady, HHA to help guide to chair due to pain and nausea    Ambulation/Gait               General Gait Details: pt began vomiing and unable to tolerate amb  Stairs            Wheelchair Mobility    Modified Rankin (Stroke Patients Only)       Balance Overall balance assessment: Mild deficits observed, not formally tested                                           Pertinent Vitals/Pain Pain Assessment Pain Assessment: 0-10 Pain Score: 8  Pain Location: neck/surgical site, 5/10 at rest, 8/10 with mobility Pain Descriptors / Indicators: Sharp Pain Intervention(s): Premedicated before session    Home Living Family/patient expects to be discharged to:: Private residence Living Arrangements: Children (mother came in from out of town to stay with patient post surgery, children 17, 74, and 55yo.) Available Help at Discharge: Family;Available 24 hours/day Type of Home: House Home Access: Stairs to enter Entrance Stairs-Rails: None Entrance Stairs-Number of Steps: 3   Home Layout: One level Home Equipment: Shower  seat      Prior Function Prior Level of Function : Independent/Modified Independent;Working/employed             Mobility Comments: driving, 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 ADLs Comments: indep     Higher education careers adviser   Dominant Hand: Right    Extremity/Trunk Assessment   Upper Extremity Assessment Upper Extremity Assessment: Overall WFL for tasks assessed    Lower Extremity Assessment Lower Extremity Assessment: Overall WFL for  tasks assessed    Cervical / Trunk Assessment Cervical / Trunk Assessment: Other exceptions Cervical / Trunk Exceptions: s/p crani  Communication   Communication: No difficulties  Cognition Arousal/Alertness: Awake/alert Behavior During Therapy: WFL for tasks assessed/performed Overall Cognitive Status: Within Functional Limits for tasks assessed                                          General Comments General comments (skin integrity, edema, etc.): VSS, HR increased to 122bpm during vomiting    Exercises     Assessment/Plan    PT Assessment Patient needs continued PT services  PT Problem List Decreased activity tolerance;Decreased balance;Decreased mobility       PT Treatment Interventions Gait training;Stair training;Functional mobility training;Therapeutic activities;Therapeutic exercise;Balance training    PT Goals (Current goals can be found in the Care Plan section)  Acute Rehab PT Goals Patient Stated Goal: get pain under control PT Goal Formulation: With patient Time For Goal Achievement: 03/26/22 Potential to Achieve Goals: Good Additional Goals Additional Goal #1: Pt to score >19 DGI to demonstrate minimal fall risk.    Frequency Min 3X/week     Co-evaluation               AM-PAC PT "6 Clicks" Mobility  Outcome Measure Help needed turning from your back to your side while in a flat bed without using bedrails?: None Help needed moving from lying on your back to sitting on the side of a flat bed without using bedrails?: None Help needed moving to and from a bed to a chair (including a wheelchair)?: A Little Help needed standing up from a chair using your arms (e.g., wheelchair or bedside chair)?: A Little Help needed to walk in hospital room?: A Little Help needed climbing 3-5 steps with a railing? : A Little 6 Click Score: 20    End of Session   Activity Tolerance: Other (comment) (limited by +emesis) Patient left: in  chair;with call bell/phone within reach Nurse Communication: Mobility status (+emesis) PT Visit Diagnosis: Unsteadiness on feet (R26.81);Muscle weakness (generalized) (M62.81);Difficulty in walking, not elsewhere classified (R26.2)    Time: 9563-8756 PT Time Calculation (min) (ACUTE ONLY): 27 min   Charges:   PT Evaluation $PT Eval Moderate Complexity: 1 Mod PT Treatments $Therapeutic Activity: 8-22 mins        Lewis Shock, PT, DPT Acute Rehabilitation Services Secure chat preferred Office #: [phone]   Iona Hansen 03/12/2022, 10:49 AM

## 2022-03-12 NOTE — Evaluation (Signed)
Occupational Therapy Evaluation Patient Details Name: Kylie Rivas MRN: [id number] DOB: 08/06/1980 Today's Date: 03/12/2022   History of Present Illness Pt is a 42yo female s/p crani for chiari malformation decompression. PMH: gerd, anemia   Clinical Impression   Pt currently supervision level for simulated selfcare tasks and mobility without an assistive device.  Increased posterior head and neck pain but less this session compared to previous session with PT.  Pt able to tolerate functional mobility greater than 200' as well as completion of toileting and grooming tasks.  Increased stiffness in neck as well.  Feel she will be modified independent to independent with ADLs once pain is controlled and will have PRN supervision from her mom and her children as needed.  No further acute or post acute OT needs at this time.  MD please advise if pt is OK for AROM cervical rotation, lateral flexion, and extension.       Recommendations for follow up therapy are one component of a multi-disciplinary discharge planning process, led by the attending physician.  Recommendations may be updated based on patient status, additional functional criteria and insurance authorization.   Follow Up Recommendations  No OT follow up     Assistance Recommended at Discharge PRN  Patient can return home with the following Assistance with cooking/housework;Assist for transportation    Functional Status Assessment  Patient has not had a recent decline in their functional status  Equipment Recommendations  None recommended by OT       Precautions / Restrictions Precautions Precautions: None Restrictions Weight Bearing Restrictions: No      Mobility Bed Mobility Overal bed mobility: Needs Assistance Bed Mobility: Supine to Sit, Sit to Supine     Supine to sit: Supervision Sit to supine: Supervision   General bed mobility comments: Increased time to complete movements secondary to pain     Transfers Overall transfer level: Needs assistance   Transfers: Sit to/from Stand, Bed to chair/wheelchair/BSC Sit to Stand: Supervision     Step pivot transfers: Supervision     General transfer comment: Pt moved slower than baseline secondary to pain but no LOB noted.      Balance Overall balance assessment: Mild deficits observed, not formally tested                                         ADL either performed or assessed with clinical judgement   ADL Overall ADL's : Modified independent                                       General ADL Comments: Pt modified independent for simulated selfcare tasks and functional transfers.  Still with increased headache and pain but able to mobilize in the room and out in the hallway without LOB and without an assistive device.  Recommend use of a shower seat initially at home until pain improves.  No further OT needs at this time.     Vision Baseline Vision/History: 1 Wears glasses Ability to See in Adequate Light: 0 Adequate Patient Visual Report: No change from baseline Vision Assessment?: No apparent visual deficits     Perception  WFLs   Praxis  WFLs    Pertinent Vitals/Pain Pain Assessment Pain Assessment: 0-10 Pain Score: 7  Pain Location: cervical pain Pain Descriptors /  Indicators: Discomfort, Headache Pain Intervention(s): Limited activity within patient's tolerance, Monitored during session, Repositioned     Hand Dominance Right   Extremity/Trunk Assessment Upper Extremity Assessment Upper Extremity Assessment: Overall WFL for tasks assessed   Lower Extremity Assessment Lower Extremity Assessment: Defer to PT evaluation   Cervical / Trunk Assessment Cervical / Trunk Assessment: Other exceptions Cervical / Trunk Exceptions: pt reports stiff neck secondary to surgery   Communication Communication Communication: No difficulties   Cognition Arousal/Alertness:  Awake/alert Behavior During Therapy: WFL for tasks assessed/performed Overall Cognitive Status: Within Functional Limits for tasks assessed                                                  Home Living Family/patient expects to be discharged to:: Private residence Living Arrangements: Children (mother came in from out of town to stay with patient post surgery, children 16, 6, and 2yo.) Available Help at Discharge: Family;Available 24 hours/day Type of Home: House Home Access: Stairs to enter [employer] of Steps: 3 Entrance Stairs-Rails: None Home Layout: One level     Bathroom Shower/Tub: Chief Strategy Officer: Standard     Home Equipment: Shower seat          Prior Functioning/Environment Prior Level of Function : Independent/Modified Independent;Working/employed             Mobility Comments: driving, 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 ADLs Comments: independent with all selfcare, working         AM-PAC OT "6 Clicks" Daily Activity     Outcome Measure Help from another person eating meals?: None Help from another person taking care of personal grooming?: None Help from another person toileting, which includes using toliet, bedpan, or urinal?: None Help from another person bathing (including washing, rinsing, drying)?: None Help from another person to put on and taking off regular upper body clothing?: None Help from another person to put on and taking off regular lower body clothing?: None 6 Click Score: 24   End of Session Nurse Communication: Mobility status  Activity Tolerance: Patient tolerated treatment well Patient left: in bed;with call bell/phone within reach                   Time: 1413-1445 OT Time Calculation (min): 32 min Charges:  OT General Charges $OT Visit: 1 Visit OT Evaluation $OT Eval Low Complexity: 1 Low OT Treatments $Self Care/Home Management : 8-22 mins Kylie Rivas  OTR/L 03/12/2022, 3:12 PM

## 2022-03-12 NOTE — Progress Notes (Signed)
Neurosurgery Service Progress Note  Subjective: No acute events overnight. Persistent n/v overnight.    Objective: Vitals:   03/12/22 0548 03/12/22 0600 03/12/22 0611 03/12/22 0700  BP:  121/89  114/83  Pulse: 100 97 (!) 101 95  Resp: 14 14 16 12   Temp:      TempSrc:      SpO2: 96% 96% 97% 96%  Weight:      Height:        Physical Exam: AAOx3, Strength 5/5 x4, SILTx4, extraoculars intact, face symmetric, incision c/d/I  Assessment & Plan: 42 y.o. female s/p suboccipital craniectomy and C1 laminectomy, recovering well.  -D/c zofran, ordered phenergan IV/PO  -Order RFP  -Start NS at 181ml/hr  -D/c foley -Transfer out of ICU  Norm Parcel, PA-C 03/12/22 8:29 AM

## 2022-03-13 ENCOUNTER — Encounter (HOSPITAL_COMMUNITY): Payer: Self-pay | Admitting: Neurological Surgery

## 2022-03-13 ENCOUNTER — Other Ambulatory Visit: Payer: Self-pay

## 2022-03-13 MED ORDER — HYDROCODONE-ACETAMINOPHEN 5-325 MG PO TABS: 1.0000 | ORAL_TABLET | ORAL | 0 refills | Status: DC | PRN

## 2022-03-13 NOTE — Discharge Summary (Signed)
Discharge Summary  Date of Admission: 03/11/2022  Date of Discharge: 03/13/22  Attending Physician: Emelda Brothers, MD  Hospital Course: Patient was admitted following an uncomplicated chiari decompression. Their preop symptoms were improved, their hospital course was uncomplicated and the patient was discharged home. They will follow up in clinic with me in clinic in 2 weeks.  Neurologic exam at discharge:  Strength 5/5 x4 and SILTx4   Discharge diagnosis: Chiari malformation  [name], MD 03/13/22 8:59 AM

## 2022-03-13 NOTE — Progress Notes (Signed)
Neurosurgery Service Progress Note  Subjective: No acute events overnight, nausea resolved, did great with PT, no headaches!   Objective: Vitals:   03/12/22 2347 03/12/22 2347 03/13/22 0401 03/13/22 0825  BP: 131/79 131/79 137/89 136/89  Pulse: 77 75 86 75  Resp: 20 20 19 18   Temp: 98.3 F (36.8 C) 98.3 F (36.8 C) 99.1 F (37.3 C) 97.8 F (36.6 C)  TempSrc: Oral Oral Oral Oral  SpO2: 99% 99% 97% 100%  Weight:      Height:        Physical Exam: Strength 5/5 x4 and SILTx4   Assessment & Plan: 42 y.o. woman s/p chiari decompression, recovering well.  -discharge home today  Kylie Rivas  03/13/22 8:56 AM

## 2022-03-13 NOTE — Progress Notes (Signed)
Physical Therapy Treatment Patient Details Name: Kylie Rivas MRN: [id number] DOB: 1980/10/23 Today's Date: 03/13/2022   History of Present Illness Pt is a 42yo female s/p crani for chiari malformation decompression. PMH: gerd, anemia    PT Comments    Pt mobilizing at supervision level at this time, ambulated great hallway distance and navigated steps demonstrating ability to enter home at d/c. Pt's main complaint is neck stiffness at this time, pt instructed in gentle cervical AROM and HEP administered, Dr. Zada Finders aware. Pt appropriate to d/c from PT perspective at this time.    Recommendations for follow up therapy are one component of a multi-disciplinary discharge planning process, led by the attending physician.  Recommendations may be updated based on patient status, additional functional criteria and insurance authorization.  Follow Up Recommendations  No PT follow up     Assistance Recommended at Discharge Intermittent Supervision/Assistance  Patient can return home with the following A little help with walking and/or transfers;A little help with bathing/dressing/bathroom;Assistance with cooking/housework   Equipment Recommendations  None recommended by PT    Recommendations for Other Services       Precautions / Restrictions Precautions Precautions: None Restrictions Weight Bearing Restrictions: No     Mobility  Bed Mobility Overal bed mobility: Modified Independent                  Transfers Overall transfer level: Modified independent Equipment used: None Transfers: Sit to/from Stand Sit to Stand: Modified independent (Device/Increase time)           General transfer comment: slow to rise, pt is cautious    Ambulation/Gait Ambulation/Gait assistance: Supervision Gait Distance (Feet): 350 Feet Assistive device: None Gait Pattern/deviations: Step-through pattern, Decreased stride length Gait velocity: decr     General Gait  Details: slowed steps, but steady and good hallway navigation. Pt able to increase speed on command   Stairs Stairs: Yes Stairs assistance: Min guard Stair Management: No rails, Step to pattern, Forwards Number of Stairs: 4 General stair comments: for safety, PT offering hand for pt to self-steady on descending steps   Wheelchair Mobility    Modified Rankin (Stroke Patients Only)       Balance Overall balance assessment: Mild deficits observed, not formally tested                                          Cognition Arousal/Alertness: Awake/alert Behavior During Therapy: WFL for tasks assessed/performed Overall Cognitive Status: Within Functional Limits for tasks assessed                                          Exercises Other Exercises Other Exercises: cervical AROM: cervical rotation, cervical lateral flexion bilat, cervical flex/ext, x10 each exercise each direction. Handout administered, Dr. Zada Finders confirmed no ROM restrictions Other Exercises: gentle STM to upper traps bilat, heat packs applied and pt educated on heat pack safety (no longer than 15 mins heat pack application)    General Comments        Pertinent Vitals/Pain Pain Assessment Pain Assessment: 0-10 Pain Score: 8  Pain Location: neck, shoulders Pain Descriptors / Indicators: Sore, Other (Comment) (muscular stiffness) Pain Intervention(s): Limited activity within patient's tolerance, Monitored during session, Repositioned, Heat applied (heat applied to upper traps)  Home Living Family/patient expects to be discharged to:: Private residence Living Arrangements: Children                      Prior Function            PT Goals (current goals can now be found in the care plan section) Acute Rehab PT Goals Patient Stated Goal: get pain under control PT Goal Formulation: With patient Time For Goal Achievement: 03/26/22 Potential to Achieve Goals:  Good Progress towards PT goals: Progressing toward goals    Frequency    Min 3X/week      PT Plan Current plan remains appropriate    Co-evaluation              AM-PAC PT "6 Clicks" Mobility   Outcome Measure  Help needed turning from your back to your side while in a flat bed without using bedrails?: None Help needed moving from lying on your back to sitting on the side of a flat bed without using bedrails?: None Help needed moving to and from a bed to a chair (including a wheelchair)?: A Little Help needed standing up from a chair using your arms (e.g., wheelchair or bedside chair)?: A Little Help needed to walk in hospital room?: A Little Help needed climbing 3-5 steps with a railing? : A Little 6 Click Score: 20    End of Session   Activity Tolerance: Other (comment) (limited by +emesis) Patient left: in bed;with bed alarm set Nurse Communication: Mobility status (+emesis) PT Visit Diagnosis: Unsteadiness on feet (R26.81);Muscle weakness (generalized) (M62.81);Difficulty in walking, not elsewhere classified (R26.2)     Time: 2355-7322 PT Time Calculation (min) (ACUTE ONLY): 22 min  Charges:  $Therapeutic Activity: 8-22 mins                     Stacie Glaze, PT DPT Acute Rehabilitation Services Pager [phone]  Office [phone]   Louis Matte 03/13/2022, 9:43 AM

## 2022-03-13 NOTE — Anesthesia Postprocedure Evaluation (Signed)
Anesthesia Post Note  Patient: Kylie Rivas  Procedure(s) Performed: Suboccipital craniotomy and Cervical One laminectomy for chiari malformation     Patient location during evaluation: PACU Anesthesia Type: General Level of consciousness: awake and alert Pain management: pain level controlled Vital Signs Assessment: post-procedure vital signs reviewed and stable Respiratory status: spontaneous breathing, nonlabored ventilation, respiratory function stable and patient connected to nasal cannula oxygen Cardiovascular status: blood pressure returned to baseline and stable Postop Assessment: no apparent nausea or vomiting Anesthetic complications: no   No notable events documented.  Last Vitals:  Vitals:   03/12/22 2347 03/13/22 0401  BP: 131/79 137/89  Pulse: 75 86  Resp: 20 19  Temp: 36.8 C 37.3 C  SpO2: 99% 97%    Last Pain:  Vitals:   03/13/22 0401  TempSrc: Oral  PainSc:                  [place] S

## 2022-03-13 NOTE — TOC Transition Note (Signed)
Transition of Care [facility]) - CM/SW Discharge Note   Patient Details  Name: Kylie Rivas MRN: [id number] Date of Birth: Dec 26, 1980  [facility] CM/SW Contact:  Pollie Friar, RN Phone Number: 03/13/2022, 9:51 AM   Clinical Narrative:    Pt is discharging home with self care. No f/u per PT/OT.  Pt has transportation home.    Final next level of care: Home/Self Care Barriers to Discharge: No Barriers Identified   Patient Goals and CMS Choice      Discharge Placement                         Discharge Plan and Services Additional resources added to the After Visit Summary for                                       Social Determinants of Health (SDOH) Interventions SDOH Screenings   Food Insecurity: No Food Insecurity (03/13/2022)  Housing: Low Risk  (03/13/2022)  Transportation Needs: No Transportation Needs (03/13/2022)  Utilities: Not At Risk (03/13/2022)  Depression (PHQ2-9): Low Risk  (08/08/2020)  Tobacco Use: Medium Risk (03/11/2022)     Readmission Risk Interventions     No data to display

## 2022-03-18 ENCOUNTER — Telehealth: Payer: Self-pay | Admitting: Neurology

## 2022-03-18 NOTE — Telephone Encounter (Signed)
Rescheduled pt to 04/02/2022 at 9:45am after her visit with neurosurgeon on 03/27/2022.

## 2022-03-18 NOTE — Telephone Encounter (Signed)
Called pt to confirm appt. Pt just had malformation decompression surgery last week would like to know if she is needing to make an appt with her neurosurgeon before coming to see Dr. April Manson. Would like a call.

## 2022-03-21 ENCOUNTER — Ambulatory Visit: Payer: Managed Care, Other (non HMO) | Admitting: Neurology

## 2022-04-02 ENCOUNTER — Ambulatory Visit (INDEPENDENT_AMBULATORY_CARE_PROVIDER_SITE_OTHER): Payer: Managed Care, Other (non HMO) | Admitting: Neurology

## 2022-04-02 ENCOUNTER — Encounter: Payer: Self-pay | Admitting: Neurology

## 2022-04-02 VITALS — BP 122/74 | Ht 64.5 in | Wt 181.0 lb

## 2022-04-02 DIAGNOSIS — G4486 Cervicogenic headache: Secondary | ICD-10-CM | POA: Diagnosis not present

## 2022-04-02 DIAGNOSIS — G935 Compression of brain: Secondary | ICD-10-CM | POA: Diagnosis not present

## 2022-04-02 MED ORDER — TIZANIDINE HCL 4 MG PO TABS: 4.0000 mg | ORAL_TABLET | Freq: Four times a day (QID) | ORAL | 0 refills | Status: AC | PRN

## 2022-04-02 NOTE — Patient Instructions (Signed)
Trial of tizanidine Continue with ibuprofen and Tylenol as needed for headache Continue to follow with Dr. Venetia Constable Follow-up in 3 months or sooner if worse.

## 2022-04-02 NOTE — Progress Notes (Signed)
[facility]  PATIENT: Kylie Rivas DOB: [birth year]  REQUESTING CLINICIAN: Minette Brine, FNP HISTORY FROM: Patient  REASON FOR VISIT: New constant headaches for the past month    HISTORICAL  CHIEF COMPLAINT:  Chief Complaint  Patient presents with   Follow-up    Rm 12 with daughter. Here for 3 month f/u. Pt reports pressure noticeable in the base of the head and behind her eyes. She is 3 weeks post decompression surgery.    INTERVAL HISTORY 04/02/2022: Patient presents today for follow-up, at last visit I did refer her to neurosurgery.  She did follow-up with Dr. Venetia Constable and has a decompression surgery on January 8.  After surgery she did well, she reported that her postnasal drip and back pain improved.  Even her headache improved until she started developing a rash around the incision site and her methocarbamol has been discontinued.  Since then she has now new headaches.  Again headache in the occipital region and travels to the front.  Denies any fall, denies any other complaints.  She did follow-up with Dr. Venetia Constable and was told that everything looks good.   HISTORY OF PRESENT ILLNESS:  This is a 42 year old woman with no reported past medical history who is presenting with complaint of constant headache for the past month.  She reports for the past month, every day she will wake up with a headache. The headaches are always on the right side, sometimes on top of the head and also in the back of the head.  On top of that she also complaints of cervicogenic headache.  She has tried medications including Tylenol without relief and currently she is taking ibuprofen 1000 mg twice a day.  She is aware that this is not good for her.   In further history she reported having headaches since childhood. She said in college she was having severe headache to the point of passing out and she saw neurology, at that time and was diagnosed with Chiari  malformation.  She was recommended surgery but she did not proceed with the evaluation.  She reported during her pregnancies she was doing well until her last child was born and that the headache reoccur.  She said the past couple years she was having headaches around her menses, she was having strong bleeding then in December she had a hysterectomy.  Since having the hysterectomy, the headache frequency increased to the point that now it is constant in the past month.   Headache History and Characteristics: Onset: Sicne childhood but now worse after her hysterectomy in December Location: Top of head and back of head Quality:  Thobbing and pressure Intensity:10 /10.  Duration: Continuous for the past month  Migrainous Features: Photophobia, phonophobia, nausea, vomiting.  Aura: No  History of brain injury or tumor: No  Family history: Motion sickness: no Cardiac history: no  OTC: tylenol, codeine, Ibupofen    Prior prophylaxis: Propranolol: No  Verapamil:No TCA: No Topamax: No Depakote: No Effexor: No Cymbalta: No Neurontin:No  Prior abortives: Triptan: No Anti-emetic: No Steroids: No Ergotamine suppository: No    OTHER MEDICAL CONDITIONS: None reported    REVIEW OF SYSTEMS: Full 14 system review of systems performed and negative with exception of: As noted in the HPI   ALLERGIES: Allergies  Allergen Reactions   Aspirin Anaphylaxis    Patient states, "I can tolerate ketorlac"   Iodinated Contrast Media Anaphylaxis   Other Anaphylaxis, Hives and Shortness Of Breath    Bee Stings  Shellfish Allergy Shortness Of Breath and Rash    HOME MEDICATIONS: Outpatient Medications Prior to Visit  Medication Sig Dispense Refill   Cholecalciferol (VITAMIN D) 125 MCG (5000 UT) CAPS Take 10,000 Units by mouth daily.     famotidine (PEPCID) 20 MG tablet Take 20 mg by mouth in the morning, at noon, and at bedtime.     ibuprofen (ADVIL) 200 MG tablet Take 400-800 mg by mouth  every 8 (eight) hours as needed for moderate pain.     Multiple Vitamin (MULTIVITAMIN WITH MINERALS) TABS tablet Take 1 tablet by mouth daily.     gabapentin (NEURONTIN) 300 MG capsule Take 1 capsule (300 mg total) by mouth 2 (two) times daily. (Patient not taking: Reported on 02/28/2022) 60 capsule 0   HYDROcodone-acetaminophen (NORCO/VICODIN) 5-325 MG tablet Take 1 tablet by mouth every 4 (four) hours as needed (pain). 20 tablet 0   simethicone (MYLICON) 80 MG chewable tablet Chew 1 tablet (80 mg total) by mouth 4 (four) times daily as needed for flatulence. (Patient not taking: Reported on 02/28/2022) 30 tablet 0   No facility-administered medications prior to visit.    PAST MEDICAL HISTORY: Past Medical History:  Diagnosis Date   Allergy    Bee stings, shellfish   Anemia    Asthma    winter time.  Last episode 2017   Family history of adverse reaction to anesthesia    Patient states her mother had a hard time waking up from anesthesia   GERD (gastroesophageal reflux disease)    Headache    Migraines   Heart murmur    Childhood   Stomach ulcer     PAST SURGICAL HISTORY: Past Surgical History:  Procedure Laterality Date   ANKLE SURGERY Right 2003   ligament repair   ANKLE SURGERY Left 2009   LIgament repair   CYSTOSCOPY  02/07/2021   Procedure: CYSTOSCOPY;  Surgeon: Crawford Givens, MD;  Location: [place];  Service: Gynecology;;   HERNIA REPAIR  0998   umbilical   LAPAROSCOPIC VAGINAL HYSTERECTOMY WITH SALPINGECTOMY Bilateral 02/07/2021   Procedure: LAPAROSCOPIC ASSISTED VAGINAL HYSTERECTOMY WITH BILATERAL SALPINGECTOMY;  Surgeon: Crawford Givens, MD;  Location: [employer] [facility] [facility];  Service: Gynecology;  Laterality: Bilateral;   SUBOCCIPITAL CRANIECTOMY CERVICAL LAMINECTOMY N/A 03/11/2022   Procedure: Suboccipital craniotomy and Cervical One laminectomy for chiari malformation;  Surgeon: [name], MD;  Location: [place];  Service: Neurosurgery;  Laterality: N/A;  RM 21 To follow    TUBAL LIGATION  2010    FAMILY HISTORY: Family History  Problem Relation Age of Onset   Diabetes Mother    Hypertension Mother    Seizures Mother        due to strokes in past   Stroke Mother    Kidney disease Mother        recurrent kidney stones--unclear what type   Heart disease Mother        Ischemic Heart Disease   Pulmonary embolism Mother        unknown clotting disorder.  Patient states she was tested for same and did not have.   Diabetes Father    Heart disease Father        MI   Stroke Father    Asthma Sister    Hypertension Sister    Cancer Brother        Primary lung, but met to nasopharyngeal area.    SOCIAL HISTORY: Social History   Socioeconomic History   Marital status: Legally Separated  Spouse name: Not on file   Number of children: Not on file   Years of education: Not on file   Highest education level: Not on file  Occupational History   Occupation: cma at [employer]   Tobacco Use   Smoking status: Former    Types: Cigarettes    Quit date: 07/08/2017    Years since quitting: 4.7   Smokeless tobacco: Never  Vaping Use   Vaping Use: Never used  Substance and Sexual Activity   Alcohol use: Yes    Comment: socially   Drug use: No   Sexual activity: Yes    Birth control/protection: Surgical  Other Topics Concern   Not on file  Social History Narrative   Not on file   Social Determinants of Health   Financial Resource Strain: Not on file  Food Insecurity: No Food Insecurity (03/13/2022)   Hunger Vital Sign    Worried About Running Out of Food in the Last Year: Never true    Ran Out of Food in the Last Year: Never true  Transportation Needs: No Transportation Needs (03/13/2022)   PRAPARE - Administrator, Civil Service (Medical): No    Lack of Transportation (Non-Medical): No  Physical Activity: Not on file  Stress: Not on file  Social Connections: Not on file  Intimate Partner Violence: Not At Risk (03/13/2022)    Humiliation, Afraid, Rape, and Kick questionnaire    Fear of Current or Ex-Partner: No    Emotionally Abused: No    Physically Abused: No    Sexually Abused: No    PHYSICAL EXAM  GENERAL EXAM/CONSTITUTIONAL: Vitals:  Vitals:   04/02/22 0954  BP: 122/74  Weight: 181 lb (82.1 kg)  Height: 5' 4.5" (1.638 m)    Body mass index is 30.59 kg/m. Wt Readings from Last 3 Encounters:  04/02/22 181 lb (82.1 kg)  03/11/22 183 lb (83 kg)  03/06/22 183 lb 8 oz (83.2 kg)   Patient is in no distress; well developed, nourished and groomed; neck is supple  EYES: Visual fields full to confrontation, Extraocular movements intacts,   MUSCULOSKELETAL: Gait, strength, tone, movements noted in Neurologic exam below  NEUROLOGIC: MENTAL STATUS:      No data to display         awake, alert, oriented to person, place and time recent and remote memory intact normal attention and concentration language fluent, comprehension intact, naming intact fund of knowledge appropriate  CRANIAL NERVE:  2nd - no papilledema or hemorrhages on fundoscopic exam 2nd, 3rd, 4th, 6th - visual fields full to confrontation, extraocular muscles intact, no nystagmus 5th - facial sensation symmetric 7th - facial strength symmetric 8th - hearing intact 9th - palate elevates symmetrically, uvula midline 11th - shoulder shrug symmetric 12th - tongue protrusion midline  MOTOR:  normal bulk and tone, full strength in the BUE, BLE  SENSORY:  normal and symmetric to light touch, pinprick  COORDINATION:  finger-nose-finger, fine finger movements normal  REFLEXES:  deep tendon reflexes present and symmetric  GAIT/STATION:  normal  DIAGNOSTIC DATA (LABS, IMAGING, TESTING) - I reviewed patient records, labs, notes, testing and imaging myself where available.  Lab Results  Component Value Date   WBC 13.0 (H) 03/11/2022   HGB 12.1 03/11/2022   HCT 36.0 03/11/2022   MCV 78.6 (L) 03/11/2022   PLT 326  03/11/2022      Component Value Date/Time   NA 135 03/12/2022 0933   NA 144 08/08/2020 1534  K 3.2 (L) 03/12/2022 0933   CL 103 03/12/2022 0933   CO2 23 03/12/2022 0933   GLUCOSE 121 (H) 03/12/2022 0933   BUN 13 03/12/2022 0933   BUN 19 08/08/2020 1534   CREATININE 0.60 03/12/2022 0933   CALCIUM 8.4 (L) 03/12/2022 0933   PROT 6.5 01/31/2021 1434   PROT 6.6 08/08/2020 1534   ALBUMIN 3.3 (L) 03/12/2022 0933   ALBUMIN 4.1 08/08/2020 1534   AST 13 (L) 01/31/2021 1434   ALT 10 01/31/2021 1434   ALKPHOS 49 01/31/2021 1434   BILITOT 0.4 01/31/2021 1434   BILITOT <0.2 08/08/2020 1534   GFRNONAA >60 03/12/2022 0933   GFRAA >60 11/08/2018 0914   Lab Results  Component Value Date   CHOL 178 08/08/2020   HDL 57 08/08/2020   LDLCALC 100 (H) 08/08/2020   TRIG 116 08/08/2020   CHOLHDL 3.1 08/08/2020   Lab Results  Component Value Date   HGBA1C 5.5 08/08/2020   No results found for: "VITAMINB12" Lab Results  Component Value Date   TSH 1.220 08/08/2020    MRI Brain 12/10/21 Normal brain MRI. There is also evidence of chiari malformation (My personal addition)    ASSESSMENT AND PLAN  42 y.o. year old female with history of headaches and Chiari malformation who is presenting for follow up.  She did have decompression surgery on January 8 and she is healing well.  She did have a rash at the site of incision, her methocarbamol has been discontinued and her headaches increased a little bit.  I will try her on tizanidine, still advised her to take Tylenol and ibuprofen as needed.  Continue to follow-up with Dr. Venetia Constable and I will see her in 3 months for follow-up or sooner if worse.    1. Chiari malformation type I ([place])   2. Cervicogenic headache      Patient Instructions  Trial of tizanidine Continue with ibuprofen and Tylenol as needed for headache Continue to follow with Dr. Venetia Constable Follow-up in 3 months or sooner if worse.   No orders of the defined types were  placed in this encounter.   Meds ordered this encounter  Medications   tiZANidine (ZANAFLEX) 4 MG tablet    Sig: Take 1 tablet (4 mg total) by mouth every 6 (six) hours as needed for muscle spasms.    Dispense:  60 tablet    Refill:  0    Return in about 3 months (around 07/02/2022).      Alric Ran, MD 04/02/2022, 10:23 AM  [facility] [address], [place], [place] [address] [phone]

## 2022-06-25 ENCOUNTER — Encounter: Payer: Self-pay | Admitting: Nurse Practitioner

## 2022-06-25 ENCOUNTER — Ambulatory Visit: Payer: Managed Care, Other (non HMO) | Admitting: Nurse Practitioner

## 2022-06-25 VITALS — BP 112/72 | HR 70 | Temp 98.3°F | Ht 64.5 in | Wt 192.0 lb

## 2022-06-25 DIAGNOSIS — Z0000 Encounter for general adult medical examination without abnormal findings: Secondary | ICD-10-CM

## 2022-06-25 DIAGNOSIS — E663 Overweight: Secondary | ICD-10-CM | POA: Diagnosis not present

## 2022-06-25 DIAGNOSIS — E7800 Pure hypercholesterolemia, unspecified: Secondary | ICD-10-CM | POA: Diagnosis not present

## 2022-06-25 DIAGNOSIS — Z6827 Body mass index (BMI) 27.0-27.9, adult: Secondary | ICD-10-CM

## 2022-06-25 DIAGNOSIS — Z8349 Family history of other endocrine, nutritional and metabolic diseases: Secondary | ICD-10-CM

## 2022-06-25 DIAGNOSIS — E041 Nontoxic single thyroid nodule: Secondary | ICD-10-CM

## 2022-06-25 DIAGNOSIS — E559 Vitamin D deficiency, unspecified: Secondary | ICD-10-CM

## 2022-06-25 DIAGNOSIS — Z79899 Other long term (current) drug therapy: Secondary | ICD-10-CM

## 2022-06-25 DIAGNOSIS — Z8639 Personal history of other endocrine, nutritional and metabolic disease: Secondary | ICD-10-CM

## 2022-06-25 MED ORDER — EPINEPHRINE 0.3 MG/0.3ML IJ SOAJ: 0.3000 mg | INTRAMUSCULAR | 3 refills | Status: AC | PRN

## 2022-06-25 NOTE — Progress Notes (Signed)
I,Sheena H Holbrook,acting as a Neurosurgeon for Arnette Felts, FNP.,have documented all relevant documentation on the behalf of Arnette Felts, FNP,as directed by  Arnette Felts, FNP while in the presence of Arnette Felts, FNP.   Subjective:     Patient ID: Kylie Rivas , female    DOB: 08/09/1980 , 42 y.o.   MRN: [id number]   Chief Complaint  Patient presents with   Annual Exam    HPI  Patient presents today for annual exam. She had chiari malformation type 1 surgical repair in January 2024. She was being followed by Dr. Bosie Clos and Dr Autumn Patty (neurosurgeon) - next appt next Friday. She had mild complications after her surgery with spinal fluid leakage.   She continues to see Dr. Normand Sloop for her mammograms.       Past Medical History:  Diagnosis Date   Allergy    Bee stings, shellfish   Anemia    Asthma    winter time.  Last episode 2017   Family history of adverse reaction to anesthesia    Patient states her mother had a hard time waking up from anesthesia   GERD (gastroesophageal reflux disease)    Headache    Migraines   Heart murmur    Childhood   Stomach ulcer      Family History  Problem Relation Age of Onset   Diabetes Mother    Hypertension Mother    Seizures Mother        due to strokes in past   Stroke Mother    Kidney disease Mother        recurrent kidney stones--unclear what type   Heart disease Mother        Ischemic Heart Disease   Pulmonary embolism Mother        unknown clotting disorder.  Patient states she was tested for same and did not have.   Diabetes Father    Heart disease Father        MI   Stroke Father    Asthma Sister    Hypertension Sister    Cancer Brother        Primary lung, but met to nasopharyngeal area.     Current Outpatient Medications:    Cholecalciferol (VITAMIN D) 125 MCG (5000 UT) CAPS, Take 10,000 Units by mouth daily., Disp: , Rfl:    EPINEPHrine (EPIPEN 2-PAK) 0.3 mg/0.3 mL IJ SOAJ injection,  Inject 0.3 mg into the muscle as needed for anaphylaxis., Disp: 1 each, Rfl: 3   famotidine (PEPCID) 20 MG tablet, Take 20 mg by mouth in the morning, at noon, and at bedtime., Disp: , Rfl:    ibuprofen (ADVIL) 200 MG tablet, Take 400-800 mg by mouth every 8 (eight) hours as needed for moderate pain., Disp: , Rfl:    Multiple Vitamin (MULTIVITAMIN WITH MINERALS) TABS tablet, Take 1 tablet by mouth daily., Disp: , Rfl:    tiZANidine (ZANAFLEX) 4 MG tablet, Take 1 tablet (4 mg total) by mouth every 6 (six) hours as needed for muscle spasms., Disp: 60 tablet, Rfl: 0   Allergies  Allergen Reactions   Aspirin Anaphylaxis    Patient states, "I can tolerate ketorlac"   Iodinated Contrast Media Anaphylaxis   Other Anaphylaxis, Hives and Shortness Of Breath    Bee Stings   Shellfish Allergy Shortness Of Breath and Rash      The patient states she uses status post hysterectomy.  Patient's last menstrual period was 01/21/2021 (exact date).. Negative for Dysmenorrhea  and Negative for Menorrhagia. Negative for: breast discharge, breast lump(s), breast pain and breast self exam. Associated symptoms include abnormal vaginal bleeding. Pertinent negatives include abnormal bleeding (hematology), anxiety, decreased libido, depression, difficulty falling sleep, dyspareunia, history of infertility, nocturia, sexual dysfunction, sleep disturbances, urinary incontinence, urinary urgency, vaginal discharge and vaginal itching. Diet regular; tries to bake her foods and eat green vegetables. .The patient states her exercise level is minimal - has been afraid to do exercised due to her complications from her chiari malformation.    The patient's tobacco use is:  Social History   Tobacco Use  Smoking Status Former   Types: Cigarettes   Quit date: 07/08/2017   Years since quitting: 4.9  Smokeless Tobacco Never  . She has been exposed to passive smoke. The patient's alcohol use is:  Social History   Substance and  Sexual Activity  Alcohol Use Yes   Comment: socially  Additional information: Last pap 08/08/2020, next one scheduled for 08/09/2023.    Review of Systems  Cardiovascular: Negative.   Gastrointestinal: Negative.   Musculoskeletal:  Positive for arthralgias.       Left hand/wrist pain worse at night. She has not tried a brace.   Skin: Negative.   Psychiatric/Behavioral: Negative.    All other systems reviewed and are negative.    Today's Vitals   06/25/22 1445  BP: 112/72  Pulse: 70  Temp: 98.3 F (36.8 C)  TempSrc: Oral  SpO2: 99%  Weight: 192 lb (87.1 kg)  Height: 5' 4.5" (1.638 m)   Body mass index is 32.45 kg/m.   Objective:  Physical Exam Vitals reviewed.  Constitutional:      General: She is not in acute distress.    Appearance: Normal appearance. She is well-developed. She is obese.  HENT:     Head: Normocephalic and atraumatic.     Right Ear: Hearing, tympanic membrane, ear canal and external ear normal. There is no impacted cerumen.     Left Ear: Hearing, tympanic membrane, ear canal and external ear normal. There is no impacted cerumen.     Nose: Nose normal.     Mouth/Throat:     Mouth: Mucous membranes are moist.  Eyes:     General: Lids are normal.     Extraocular Movements: Extraocular movements intact.     Conjunctiva/sclera: Conjunctivae normal.     Pupils: Pupils are equal, round, and reactive to light.     Funduscopic exam:    Right eye: No papilledema.        Left eye: No papilledema.  Neck:     Thyroid: No thyroid mass.     Vascular: No carotid bruit.  Cardiovascular:     Rate and Rhythm: Normal rate and regular rhythm.     Pulses: Normal pulses.     Heart sounds: Normal heart sounds. No murmur heard. Pulmonary:     Effort: Pulmonary effort is normal. No respiratory distress.     Breath sounds: Normal breath sounds. No wheezing.  Chest:     Chest wall: No mass.  Breasts:    Tanner Score is 5.     Right: Normal. No mass or tenderness.      Left: Normal. No mass or tenderness.  Abdominal:     General: Abdomen is flat. Bowel sounds are normal. There is no distension.     Palpations: Abdomen is soft.     Tenderness: There is no abdominal tenderness.  Genitourinary:    Rectum: Guaiac result negative.  Musculoskeletal:        General: No swelling. Normal range of motion.     Cervical back: Full passive range of motion without pain, normal range of motion and neck supple.     Right lower leg: No edema.     Left lower leg: No edema.  Lymphadenopathy:     Upper Body:     Right upper body: No supraclavicular, axillary or pectoral adenopathy.     Left upper body: No supraclavicular, axillary or pectoral adenopathy.  Skin:    General: Skin is warm and dry.     Capillary Refill: Capillary refill takes less than 2 seconds.  Neurological:     General: No focal deficit present.     Mental Status: She is alert and oriented to person, place, and time.     Cranial Nerves: No cranial nerve deficit.     Sensory: No sensory deficit.  Psychiatric:        Mood and Affect: Mood normal.        Behavior: Behavior normal.        Thought Content: Thought content normal.        Judgment: Judgment normal.         Assessment And Plan:     1. Annual physical exam Behavior modifications discussed and diet history reviewed.   Pt will continue to exercise regularly and modify diet with low GI, plant based foods and decrease intake of processed foods.  Recommend intake of daily multivitamin, Vitamin D, and calcium.  Recommend mammogram for preventive screenings, as well as recommend immunizations that include influenza, TDAP  2. Overweight with body mass index (BMI) of 27 to 27.9 in adult  3. Family history of thyroid disease - TSH - T3 - T4, Free - US THYROID; Future  4. Vitamin D deficiency Will check vitamin D level and supplement as needed.    Also encouraged to spend 15 minutes in the sun daily.  - VITAMIN D 25 Hydroxy (Vit-D  Deficiency, Fractures)  5. Thyroid nodule Comments: Will check thyroid ultrasound - TSH - T3 - T4, Free - US THYROID; Future  6. Elevated LDL cholesterol level Comments: No current medications, focus on diet low in fat. - CMP14+EGFR - Lipid panel  7. History of elevated glucose - Hemoglobin A1c - CMP14+EGFR  8. Other long term (current) drug therapy - CBC    Patient was given opportunity to ask questions. Patient verbalized understanding of the plan and was able to repeat key elements of the plan. All questions were answered to their satisfaction.   Arnette Felts, FNP   I, Arnette Felts, FNP, have reviewed all documentation for this visit. The documentation on 06/25/22 for the exam, diagnosis, procedures, and orders are all accurate and complete.   THE PATIENT IS ENCOURAGED TO PRACTICE SOCIAL DISTANCING DUE TO THE COVID-19 PANDEMIC.

## 2022-06-26 LAB — CMP14+EGFR
ALT: 6 IU/L (ref 0–32)
AST: 13 IU/L (ref 0–40)
Albumin/Globulin Ratio: 1.7 (ref 1.2–2.2)
Albumin: 4.3 g/dL (ref 3.9–4.9)
Alkaline Phosphatase: 70 IU/L (ref 44–121)
BUN/Creatinine Ratio: 21 (ref 9–23)
BUN: 16 mg/dL (ref 6–24)
Bilirubin Total: <0.2 mg/dL (ref 0.0–1.2)
CO2: 20 mmol/L (ref 20–29)
Calcium: 9.8 mg/dL (ref 8.7–10.2)
Chloride: 102 mmol/L (ref 96–106)
Creatinine, Ser: 0.75 mg/dL (ref 0.57–1.00)
Globulin, Total: 2.6 g/dL (ref 1.5–4.5)
Glucose: 80 mg/dL (ref 70–99)
Potassium: 4.4 mmol/L (ref 3.5–5.2)
Sodium: 138 mmol/L (ref 134–144)
Total Protein: 6.9 g/dL (ref 6.0–8.5)
eGFR: 102 mL/min/{1.73_m2} (ref >59)

## 2022-06-26 LAB — CBC
Hematocrit: 38.8 % (ref 34.0–46.6)
Hemoglobin: 12.4 g/dL (ref 11.1–15.9)
MCH: 26 pg — ABNORMAL LOW (ref 26.6–33.0)
MCHC: 32 g/dL (ref 31.5–35.7)
MCV: 81 fL (ref 79–97)
Platelets: 314 10*3/uL (ref 150–450)
RBC: 4.77 x10E6/uL (ref 3.77–5.28)
RDW: 12.7 % (ref 11.7–15.4)
WBC: 7.8 10*3/uL (ref 3.4–10.8)

## 2022-06-26 LAB — HEMOGLOBIN A1C
Est. average glucose Bld gHb Est-mCnc: 111 mg/dL
Hgb A1c MFr Bld: 5.5 % (ref 4.8–5.6)

## 2022-06-26 LAB — LIPID PANEL
Chol/HDL Ratio: 3.4 ratio (ref 0.0–4.4)
Cholesterol, Total: 215 mg/dL — ABNORMAL HIGH (ref 100–199)
HDL: 63 mg/dL (ref >39)
LDL Chol Calc (NIH): 124 mg/dL — ABNORMAL HIGH (ref 0–99)
Triglycerides: 161 mg/dL — ABNORMAL HIGH (ref 0–149)
VLDL Cholesterol Cal: 28 mg/dL (ref 5–40)

## 2022-06-26 LAB — T4, FREE: Free T4: 1.48 ng/dL (ref 0.82–1.77)

## 2022-06-26 LAB — TSH: TSH: 1.5 u[IU]/mL (ref 0.450–4.500)

## 2022-06-26 LAB — T3: T3, Total: 123 ng/dL (ref 71–180)

## 2022-06-26 LAB — VITAMIN D 25 HYDROXY (VIT D DEFICIENCY, FRACTURES): Vit D, 25-Hydroxy: 27.3 ng/mL — ABNORMAL LOW (ref 30.0–100.0)

## 2022-07-16 ENCOUNTER — Ambulatory Visit: Payer: Managed Care, Other (non HMO) | Admitting: Neurology

## 2022-07-16 ENCOUNTER — Encounter: Payer: Self-pay | Admitting: Neurology

## 2022-07-30 ENCOUNTER — Encounter: Payer: Self-pay | Admitting: Nurse Practitioner

## 2023-06-26 ENCOUNTER — Encounter: Payer: Managed Care, Other (non HMO) | Admitting: Nurse Practitioner

## 2023-06-26 NOTE — Progress Notes (Deleted)
 [place], CMA,acting as a Neurosurgeon for Kylie Epley, FNP.,have documented all relevant documentation on the behalf of Kylie Epley, FNP,as directed by  Kylie Epley, FNP while in the presence of Kylie Epley, FNP.  Subjective:    Patient ID: Kylie Rivas , female    DOB: 1980/03/10 , 43 y.o.   MRN: [id number]  No chief complaint on file.   HPI  Patient presents today for HM, Patient reports compliance with medication. Patient denies any chest pain, SOB, or headaches. Patient has no concerns today.     Past Medical History:  Diagnosis Date  . Allergy    Bee stings, shellfish  . Anemia   . Asthma    winter time.  Last episode 2017  . Family history of adverse reaction to anesthesia    Patient states her mother had a hard time waking up from anesthesia  . GERD (gastroesophageal reflux disease)   . Headache    Migraines  . Heart murmur    Childhood  . Stomach ulcer      Family History  Problem Relation Age of Onset  . Diabetes Mother   . Hypertension Mother   . Seizures Mother        due to strokes in past  . Stroke Mother   . Kidney disease Mother        recurrent kidney stones--unclear what type  . Heart disease Mother        Ischemic Heart Disease  . Pulmonary embolism Mother        unknown clotting disorder.  Patient states she was tested for same and did not have.  . Diabetes Father   . Heart disease Father        MI  . Stroke Father   . Asthma Sister   . Hypertension Sister   . Cancer Brother        Primary lung, but met to nasopharyngeal area.     Current Outpatient Medications:  .  Cholecalciferol (VITAMIN D ) 125 MCG (5000 UT) CAPS, Take 10,000 Units by mouth daily., Disp: , Rfl:  .  EPINEPHrine  (EPIPEN  2-PAK) 0.3 mg/0.3 mL IJ SOAJ injection, Inject 0.3 mg into the muscle as needed for anaphylaxis., Disp: 1 each, Rfl: 3 .  famotidine  (PEPCID ) 20 MG tablet, Take 20 mg by mouth in the morning, at noon, and at bedtime., Disp: , Rfl:  .   ibuprofen (ADVIL) 200 MG tablet, Take 400-800 mg by mouth every 8 (eight) hours as needed for moderate pain., Disp: , Rfl:  .  Multiple Vitamin (MULTIVITAMIN WITH MINERALS) TABS tablet, Take 1 tablet by mouth daily., Disp: , Rfl:  .  tiZANidine  (ZANAFLEX ) 4 MG tablet, Take 1 tablet (4 mg total) by mouth every 6 (six) hours as needed for muscle spasms., Disp: 60 tablet, Rfl: 0   Allergies  Allergen Reactions  . Aspirin Anaphylaxis    Patient states, "I can tolerate ketorlac"  . Iodinated Contrast Media Anaphylaxis  . Other Anaphylaxis, Hives and Shortness Of Breath    Bee Stings  . Shellfish Allergy Shortness Of Breath and Rash      The patient states she uses {contraceptive methods:5051} for birth control. Patient's last menstrual period was 01/21/2021 (exact date).. {Dysmenorrhea-menorrhagia:21918}. Negative for: breast discharge, breast lump(s), breast pain and breast self exam. Associated symptoms include abnormal vaginal bleeding. Pertinent negatives include abnormal bleeding (hematology), anxiety, decreased libido, depression, difficulty falling sleep, dyspareunia, history of infertility, nocturia, sexual dysfunction, sleep disturbances, urinary incontinence, urinary urgency,  vaginal discharge and vaginal itching. Diet regular.The patient states her exercise level is    . The patient's tobacco use is:  Social History   Tobacco Use  Smoking Status Former  . Current packs/day: 0.00  . Types: Cigarettes  . Quit date: 07/08/2017  . Years since quitting: 5.9  Smokeless Tobacco Never  . She has been exposed to passive smoke. The patient's alcohol use is:  Social History   Substance and Sexual Activity  Alcohol Use Yes   Comment: socially  . Additional information: Last pap ***, next one scheduled for ***.    Review of Systems   There were no vitals filed for this visit. There is no height or weight on file to calculate BMI.  Wt Readings from Last 3 Encounters:  06/25/22 192 lb  (87.1 kg)  04/02/22 181 lb (82.1 kg)  03/11/22 183 lb (83 kg)     Objective:  Physical Exam      Assessment And Plan:     Encounter for annual health examination  Vitamin D  deficiency     No follow-ups on file. Patient was given opportunity to ask questions. Patient verbalized understanding of the plan and was able to repeat key elements of the plan. All questions were answered to their satisfaction.   Kylie Epley, FNP  I, Kylie Epley, FNP, have reviewed all documentation for this visit. The documentation on 06/26/23 for the exam, diagnosis, procedures, and orders are all accurate and complete.
